# Patient Record
Sex: Female | Born: 1955 | Race: Black or African American | Hispanic: No | Smoking: Current every day smoker
Health system: Southern US, Community
[De-identification: ages and names within clinical notes are randomized; demographics above are authoritative.]

## PROBLEM LIST (undated history)

## (undated) DIAGNOSIS — C189 Malignant neoplasm of colon, unspecified: Secondary | ICD-10-CM

## (undated) DIAGNOSIS — C801 Malignant (primary) neoplasm, unspecified: Secondary | ICD-10-CM

## (undated) DIAGNOSIS — E079 Disorder of thyroid, unspecified: Secondary | ICD-10-CM

## (undated) HISTORY — PX: PORTA CATH INSERTION: CATH118285

## (undated) HISTORY — PX: COLON SURGERY: SHX602

---

## 1998-09-13 ENCOUNTER — Emergency Department (HOSPITAL_COMMUNITY): Admission: EM | Admit: 1998-09-13 | Discharge: 1998-09-13 | Payer: Self-pay | Admitting: Emergency Medicine

## 1999-11-06 ENCOUNTER — Emergency Department (HOSPITAL_COMMUNITY): Admission: EM | Admit: 1999-11-06 | Discharge: 1999-11-06 | Payer: Self-pay | Admitting: Emergency Medicine

## 2002-04-11 ENCOUNTER — Emergency Department (HOSPITAL_COMMUNITY): Admission: EM | Admit: 2002-04-11 | Discharge: 2002-04-11 | Payer: Self-pay | Admitting: Emergency Medicine

## 2002-04-12 ENCOUNTER — Encounter: Payer: Self-pay | Admitting: Emergency Medicine

## 2002-09-26 ENCOUNTER — Encounter: Payer: Self-pay | Admitting: Emergency Medicine

## 2002-09-26 ENCOUNTER — Encounter: Payer: Self-pay | Admitting: General Surgery

## 2002-09-27 ENCOUNTER — Encounter: Payer: Self-pay | Admitting: General Surgery

## 2002-09-27 ENCOUNTER — Inpatient Hospital Stay (HOSPITAL_COMMUNITY): Admission: EM | Admit: 2002-09-27 | Discharge: 2002-10-05 | Payer: Self-pay | Admitting: Emergency Medicine

## 2002-09-28 ENCOUNTER — Encounter: Payer: Self-pay | Admitting: Anesthesiology

## 2003-07-10 ENCOUNTER — Emergency Department (HOSPITAL_COMMUNITY): Admission: EM | Admit: 2003-07-10 | Discharge: 2003-07-10 | Payer: Self-pay | Admitting: Emergency Medicine

## 2008-04-17 HISTORY — PX: TUMOR REMOVAL: SHX12

## 2013-02-17 ENCOUNTER — Encounter (HOSPITAL_COMMUNITY): Payer: Self-pay | Admitting: Emergency Medicine

## 2013-02-17 ENCOUNTER — Emergency Department (HOSPITAL_COMMUNITY): Payer: Self-pay

## 2013-02-17 ENCOUNTER — Emergency Department (HOSPITAL_COMMUNITY)
Admission: EM | Admit: 2013-02-17 | Discharge: 2013-02-17 | Disposition: A | Discharge status: Home / Self Care | Payer: Self-pay | Attending: Emergency Medicine | Admitting: Emergency Medicine

## 2013-02-17 DIAGNOSIS — Z85038 Personal history of other malignant neoplasm of large intestine: Secondary | ICD-10-CM | POA: Insufficient documentation

## 2013-02-17 DIAGNOSIS — R748 Abnormal levels of other serum enzymes: Secondary | ICD-10-CM | POA: Insufficient documentation

## 2013-02-17 DIAGNOSIS — R609 Edema, unspecified: Secondary | ICD-10-CM | POA: Insufficient documentation

## 2013-02-17 DIAGNOSIS — E876 Hypokalemia: Secondary | ICD-10-CM | POA: Insufficient documentation

## 2013-02-17 DIAGNOSIS — F17200 Nicotine dependence, unspecified, uncomplicated: Secondary | ICD-10-CM | POA: Insufficient documentation

## 2013-02-17 LAB — URINALYSIS, ROUTINE W REFLEX MICROSCOPIC
Ketones, ur: NEGATIVE mg/dL
Nitrite: POSITIVE — AB
Protein, ur: 30 mg/dL — AB
pH: 5.5 (ref 5.0–8.0)

## 2013-02-17 LAB — COMPREHENSIVE METABOLIC PANEL
BUN: 13 mg/dL (ref 6–23)
CO2: 27 mEq/L (ref 19–32)
Chloride: 101 mEq/L (ref 96–112)
Creatinine, Ser: 0.39 mg/dL — ABNORMAL LOW (ref 0.50–1.10)
GFR calc non Af Amer: >90 mL/min (ref >90)
Glucose, Bld: 113 mg/dL — ABNORMAL HIGH (ref 70–99)
Total Bilirubin: 0.8 mg/dL (ref 0.3–1.2)

## 2013-02-17 LAB — CBC WITH DIFFERENTIAL/PLATELET
Basophils Absolute: 0 10*3/uL (ref 0.0–0.1)
Eosinophils Absolute: 0.2 10*3/uL (ref 0.0–0.7)
Eosinophils Relative: 2 % (ref 0–5)
HCT: 38.3 % (ref 36.0–46.0)
Hemoglobin: 12.3 g/dL (ref 12.0–15.0)
Lymphocytes Relative: 30 % (ref 12–46)
MCH: 27.3 pg (ref 26.0–34.0)
MCHC: 32.1 g/dL (ref 30.0–36.0)
MCV: 85.1 fL (ref 78.0–100.0)
Monocytes Absolute: 0.9 10*3/uL (ref 0.1–1.0)
Monocytes Relative: 11 % (ref 3–12)
Neutro Abs: 4.3 10*3/uL (ref 1.7–7.7)
RDW: 15 % (ref 11.5–15.5)
WBC: 7.6 10*3/uL (ref 4.0–10.5)

## 2013-02-17 LAB — URINE MICROSCOPIC-ADD ON

## 2013-02-17 LAB — PRO B NATRIURETIC PEPTIDE: Pro B Natriuretic peptide (BNP): 1769 pg/mL — ABNORMAL HIGH (ref 0–125)

## 2013-02-17 MED ORDER — POTASSIUM CHLORIDE CRYS ER 20 MEQ PO TBCR
40.0000 meq | EXTENDED_RELEASE_TABLET | Freq: Once | ORAL | Status: AC
  Administered 2013-02-17: 40 meq via ORAL
  Filled 2013-02-17: qty 2

## 2013-02-17 MED ORDER — FUROSEMIDE 20 MG PO TABS: 20.0000 mg | ORAL_TABLET | Freq: Every day | ORAL | Status: DC

## 2013-02-17 MED ORDER — POTASSIUM CHLORIDE ER 10 MEQ PO TBCR: 20.0000 meq | EXTENDED_RELEASE_TABLET | Freq: Two times a day (BID) | ORAL | Status: DC

## 2013-02-17 NOTE — ED Notes (Signed)
Called to triage area-pt in triage and upset because she wants a sandwich-escorted back to room-allowed to express frustration over being here "for so long"-Dr. Rhunette Croft is aware and will be in to discuss pt's lab work

## 2013-02-17 NOTE — ED Notes (Signed)
Pt ambulated to the restroom in attempt to obtain a urine sample. Pt voided however forgot to catch a clean sample. Will attempt later.

## 2013-02-17 NOTE — ED Notes (Addendum)
Pt states that she started having leg swelling 2 weeks ago. Denies diabetes or heart problems. Notes that she has had unprotected sexual relations with a man who has Hep C.

## 2013-02-17 NOTE — ED Provider Notes (Addendum)
CSN: [id number]     Arrival date & time 02/17/13  1647 History   First MD Initiated Contact with Patient 02/17/13 1812     Chief Complaint  Patient presents with  . Leg Swelling   (Consider location/radiation/quality/duration/timing/severity/associated sxs/prior Treatment) HPI Comments: 57 y/o with no medical hx, remote hx of colon CA, s/p resection comes in with cc of leg swelling. Pt started having bilateral leg swelling 2 weeks ago, and it is getting worse. There is no pain. Pt has no n/v/f/c. Pt has no hx of liver dz, however, has her boy friend has hep c. She denies any heart hx, and there is no dib. No hx of DVT, PE.  The history is provided by the patient.    No past medical history on file. Past Surgical History  Procedure Laterality Date  . Tumor removal  2010    colon   No family history on file. History  Substance Use Topics  . Smoking status: Current Every Day Smoker    Types: Cigarettes  . Smokeless tobacco: Not on file  . Alcohol Use: Yes     Comment: occasional   OB History   Grav Para Term Preterm Abortions TAB SAB Ect Mult Living                 Review of Systems  Constitutional: Negative for activity change.  HENT: Negative for facial swelling.   Respiratory: Negative for cough, shortness of breath and wheezing.   Cardiovascular: Negative for chest pain.  Gastrointestinal: Negative for nausea, vomiting, abdominal pain, diarrhea, constipation, blood in stool and abdominal distention.  Genitourinary: Negative for hematuria and difficulty urinating.  Musculoskeletal: Negative for neck pain.  Skin: Negative for color change and rash.  Neurological: Negative for speech difficulty and weakness.  Hematological: Does not bruise/bleed easily.  Psychiatric/Behavioral: Negative for confusion.    Allergies  Review of patient's allergies indicates no known allergies.  Home Medications  No current outpatient prescriptions on file. BP 126/80  Pulse 102   Temp(Src) 97.8 F (36.6 C) (Oral)  Resp 18  SpO2 95% Physical Exam  Nursing note and vitals reviewed. Constitutional: She is oriented to person, place, and time. She appears well-developed and well-nourished.  HENT:  Head: Normocephalic and atraumatic.  Eyes: EOM are normal. Pupils are equal, round, and reactive to light.  Neck: Neck supple.  Cardiovascular: Normal rate, regular rhythm and normal heart sounds.   No murmur heard. Pulmonary/Chest: Effort normal. No respiratory distress.  Abdominal: Soft. She exhibits no distension. There is no tenderness. There is no rebound and no guarding.  Musculoskeletal: She exhibits edema.  Bilateral and equal 1 + pitting edema. No erythema, no calf tenderness.  Neurological: She is alert and oriented to person, place, and time.  Skin: Skin is warm and dry.    ED Course  Procedures (including critical care time) Labs Review Labs Reviewed  COMPREHENSIVE METABOLIC PANEL - Abnormal; Notable for the following:    Potassium 2.5 (*)    Glucose, Bld 113 (*)    Creatinine, Ser 0.39 (*)    Albumin 3.2 (*)    AST 48 (*)    Alkaline Phosphatase 121 (*)    All other components within normal limits  PRO B NATRIURETIC PEPTIDE - Abnormal; Notable for the following:    Pro B Natriuretic peptide (BNP) 1769.0 (*)    All other components within normal limits  URINALYSIS, ROUTINE W REFLEX MICROSCOPIC - Abnormal; Notable for the following:    Color,  Urine AMBER (*)    APPearance TURBID (*)    Hgb urine dipstick MODERATE (*)    Bilirubin Urine SMALL (*)    Protein, ur 30 (*)    Nitrite POSITIVE (*)    Leukocytes, UA LARGE (*)    All other components within normal limits  URINE MICROSCOPIC-ADD ON - Abnormal; Notable for the following:    Bacteria, UA MANY (*)    All other components within normal limits  URINE CULTURE  CBC WITH DIFFERENTIAL   Imaging Review US Abdomen Complete  02/17/2013   CLINICAL DATA:  57 year old female with right upper  quadrant pain. Initial encounter.  EXAM: ULTRASOUND ABDOMEN COMPLETE  COMPARISON:  None.  FINDINGS: Gallbladder  Partially decompressed. No gallstones or wall thickening visualized. No sonographic Murphy sign noted.  Common bile duct  Diameter: 3 mm, normal.  Liver  No focal lesion identified. Within normal limits in parenchymal echogenicity.  IVC  No abnormality visualized.  Pancreas  Visualized portion unremarkable.  Spleen  Size and appearance within normal limits.  Right Kidney  Length: 11.2 cm. Echogenicity within normal limits. No mass or hydronephrosis visualized.  Left Kidney  Length: 10.4 cm. Echogenicity within normal limits. No mass or hydronephrosis visualized.  Abdominal aorta  No aneurysm visualized.  IMPRESSION: Normal gallbladder. Negative abdominal ultrasound.   Electronically Signed   By: Augusto Gamble M.D.   On: 02/17/2013 21:20    EKG Interpretation     Ventricular Rate:  100 PR Interval:  136 QRS Duration: 74 QT Interval:  397 QTC Calculation: 512 R Axis:   -43 Text Interpretation:  Sinus tachycardia Multiple ventricular premature complexes Probable left atrial enlargement Left axis deviation Abnormal T, consider ischemia, diffuse leads Prolonged QT interval New t wave inversions in the inferiolateral leads            MDM  No diagnosis found.  Pt comes in with cc of bilateral pitting edema. No cardiac hx, and no hx of liver dz - however, states that her boy friend has hep c (no other risk factors for liver dz). She has no hx of PE, DVt, and chances of bilateral DVTs is low - in light of no leg  Pain and no DVT risk factors.  Pt's lab show slightly elevated liver enz and BNP. She has hypokalemia as well.  [place] RUQ - negative.  I discussed all the findings with the patient. She will see her PCP in [place] within a week, and has good insurance and follow up, and is quite reliable, with her family at bedside too - who are also reliable. They prefer outpatient workup -and  i think that is reasonable given the good f/u and social circumstances.  We have giver her GI f/u with our Hospital just in case.  I think she needs and outpatient echo and outpatient GI fu. Her EKG shows new t wave inversions compared to 2004, so she likely had some insult to her heart. Potassium supplement and lasix prescribed.  UA is dirty - but she has no UTI like sx.      Derwood Kaplan, MD 02/17/13 6213     Derwood Kaplan, MD 02/17/13 2316

## 2013-02-17 NOTE — ED Notes (Signed)
Dr. Rhunette Croft aware of pt's critical potassium level

## 2013-02-17 NOTE — ED Notes (Signed)
Critical K of 2.5 reported to [employer]

## 2013-02-17 NOTE — ED Notes (Signed)
US at bedside

## 2013-02-17 NOTE — ED Notes (Signed)
Patient has gross edema to bilateral lowe extremities x 2 weeks. Patient denies any pain at this time.

## 2013-02-19 ENCOUNTER — Telehealth (HOSPITAL_BASED_OUTPATIENT_CLINIC_OR_DEPARTMENT_OTHER): Payer: Self-pay | Admitting: *Deleted

## 2013-02-19 LAB — URINE CULTURE: Colony Count: >=100000

## 2013-02-19 NOTE — Telephone Encounter (Signed)
Pt wants referral for dr in [facility], informed pt that she would need to call pcp. Pt call PCP and they wanted faxed records of her visit. Pt did not follow up with her chemo after colon CA three yrs ago. Pt was given information to call medical records about getting records faxed to drs office in [facility] [phone]. Her number is [phone].

## 2013-02-20 NOTE — Progress Notes (Signed)
ED Antimicrobial Stewardship Positive Culture Follow Up   HAJRA PORT is an 57 y.o. female who presented to [facility] on 02/17/2013 with a chief complaint of  Chief Complaint  Patient presents with  . Leg Swelling    Recent Results (from the past 720 hour(s))  URINE CULTURE     Status: None   Collection Time    02/17/13  7:45 PM      Result Value Range Status   Specimen Description URINE, CLEAN CATCH   Final   Special Requests NONE   Final   Culture  Setup Time     Final   Value: 02/18/2013 03:15     Performed at [facility] Count     Final   Value: >=100,000 COLONIES/ML     Performed at [facility]   Culture     Final   Value: ESCHERICHIA COLI     Performed at [facility]   Report Status 02/19/2013 FINAL   Final   Organism ID, Bacteria ESCHERICHIA COLI   Final     [x]  Patient discharged originally without antimicrobial agent and treatment is now indicated  New antibiotic prescription: Bactrim DS one tablet BID x 3 days  ED Provider: Fayrene Helper, PA-C   Mickeal Skinner 02/20/2013, 10:11 AM Infectious Diseases Pharmacist Phone# [phone]

## 2013-02-20 NOTE — ED Notes (Signed)
Post ED Visit - Positive Culture Follow-up: Successful Patient Follow-Up  Culture assessed and recommendations reviewed by: []  Wes Dulaney, Pharm.D., BCPS [x]  Celedonio Miyamoto, Pharm.D., BCPS []  Georgina Pillion, [address].D., BCPS []  [place], [address].D., BCPS, AAHIVP []  Estella Husk, Pharm.D., BCPS, AAHIVP  [X]  Patient discharged originally without antimicrobial agent and treatment is now indicated  New antibiotic prescription: Bactrim DS one tablet BID x 3 days  ED Provider: Fayrene Helper, PA-C      Susan Pearson 02/20/2013, 3:13 PM

## 2013-02-22 ENCOUNTER — Telehealth (HOSPITAL_COMMUNITY): Payer: Self-pay | Admitting: *Deleted

## 2013-02-22 NOTE — ED Notes (Signed)
Pt called several times about +UTI,  Pt calling sts she did NOT have RUQ pain and she wants to know why this is on her record.  Pt requesting to speak with Doctor or nurse that saw her.  Pt given number for [employer].

## 2013-02-26 ENCOUNTER — Telehealth (HOSPITAL_COMMUNITY): Payer: Self-pay | Admitting: Emergency Medicine

## 2013-02-26 NOTE — ED Notes (Signed)
Rx for Bactrim DS 1 tablet BID x 3 days called to [facility] ([phone]). Rx prescribed by Fayrene Helper PA-C.

## 2016-07-11 ENCOUNTER — Emergency Department (HOSPITAL_COMMUNITY): Payer: Self-pay

## 2016-07-11 ENCOUNTER — Inpatient Hospital Stay (HOSPITAL_COMMUNITY): Admit: 2016-07-11 | Payer: Medicaid Other

## 2016-07-11 ENCOUNTER — Encounter (HOSPITAL_COMMUNITY): Payer: Self-pay | Admitting: *Deleted

## 2016-07-11 ENCOUNTER — Observation Stay (HOSPITAL_COMMUNITY)
Admission: EM | Admit: 2016-07-11 | Discharge: 2016-07-12 | Disposition: A | Discharge status: Home / Self Care | Payer: Self-pay | Attending: Internal Medicine | Admitting: Internal Medicine

## 2016-07-11 DIAGNOSIS — R05 Cough: Secondary | ICD-10-CM

## 2016-07-11 DIAGNOSIS — Z9221 Personal history of antineoplastic chemotherapy: Secondary | ICD-10-CM | POA: Insufficient documentation

## 2016-07-11 DIAGNOSIS — D72829 Elevated white blood cell count, unspecified: Secondary | ICD-10-CM | POA: Insufficient documentation

## 2016-07-11 DIAGNOSIS — I2721 Secondary pulmonary arterial hypertension: Secondary | ICD-10-CM | POA: Insufficient documentation

## 2016-07-11 DIAGNOSIS — E0590 Thyrotoxicosis, unspecified without thyrotoxic crisis or storm: Secondary | ICD-10-CM

## 2016-07-11 DIAGNOSIS — I2699 Other pulmonary embolism without acute cor pulmonale: Secondary | ICD-10-CM

## 2016-07-11 DIAGNOSIS — R042 Hemoptysis: Secondary | ICD-10-CM | POA: Insufficient documentation

## 2016-07-11 DIAGNOSIS — F17210 Nicotine dependence, cigarettes, uncomplicated: Secondary | ICD-10-CM | POA: Insufficient documentation

## 2016-07-11 DIAGNOSIS — J9601 Acute respiratory failure with hypoxia: Principal | ICD-10-CM | POA: Insufficient documentation

## 2016-07-11 DIAGNOSIS — R0602 Shortness of breath: Secondary | ICD-10-CM | POA: Diagnosis present

## 2016-07-11 DIAGNOSIS — R059 Cough, unspecified: Secondary | ICD-10-CM

## 2016-07-11 DIAGNOSIS — Z85038 Personal history of other malignant neoplasm of large intestine: Secondary | ICD-10-CM | POA: Insufficient documentation

## 2016-07-11 HISTORY — DX: Malignant neoplasm of colon, unspecified: C18.9

## 2016-07-11 HISTORY — DX: Malignant (primary) neoplasm, unspecified: C80.1

## 2016-07-11 HISTORY — DX: Disorder of thyroid, unspecified: E07.9

## 2016-07-11 LAB — CBC WITH DIFFERENTIAL/PLATELET
Basophils Absolute: 0 10*3/uL (ref 0.0–0.1)
Basophils Relative: 0 %
EOS PCT: 3 %
Eosinophils Absolute: 0.3 10*3/uL (ref 0.0–0.7)
HCT: 39.7 % (ref 36.0–46.0)
Hemoglobin: 13.1 g/dL (ref 12.0–15.0)
LYMPHS PCT: 22 %
Lymphs Abs: 2.6 10*3/uL (ref 0.7–4.0)
MCH: 29.6 pg (ref 26.0–34.0)
MCHC: 33 g/dL (ref 30.0–36.0)
MCV: 89.8 fL (ref 78.0–100.0)
MONO ABS: 1 10*3/uL (ref 0.1–1.0)
Monocytes Relative: 8 %
Neutro Abs: 7.9 10*3/uL — ABNORMAL HIGH (ref 1.7–7.7)
Neutrophils Relative %: 67 %
PLATELETS: 364 10*3/uL (ref 150–400)
RBC: 4.42 MIL/uL (ref 3.87–5.11)
RDW: 14.1 % (ref 11.5–15.5)
WBC: 11.8 10*3/uL — ABNORMAL HIGH (ref 4.0–10.5)

## 2016-07-11 LAB — COMPREHENSIVE METABOLIC PANEL
ALT: 15 U/L (ref 14–54)
AST: 25 U/L (ref 15–41)
Albumin: 3 g/dL — ABNORMAL LOW (ref 3.5–5.0)
Alkaline Phosphatase: 165 U/L — ABNORMAL HIGH (ref 38–126)
Anion gap: 8 (ref 5–15)
BUN: 5 mg/dL — ABNORMAL LOW (ref 6–20)
CO2: 24 mmol/L (ref 22–32)
Calcium: 8.4 mg/dL — ABNORMAL LOW (ref 8.9–10.3)
Chloride: 105 mmol/L (ref 101–111)
Creatinine, Ser: 0.42 mg/dL — ABNORMAL LOW (ref 0.44–1.00)
Glucose, Bld: 95 mg/dL (ref 65–99)
POTASSIUM: 4.3 mmol/L (ref 3.5–5.1)
Sodium: 137 mmol/L (ref 135–145)
Total Bilirubin: 1 mg/dL (ref 0.3–1.2)
Total Protein: 7.2 g/dL (ref 6.5–8.1)

## 2016-07-11 LAB — I-STAT CHEM 8, ED
BUN: 4 mg/dL — ABNORMAL LOW (ref 6–20)
CREATININE: 0.4 mg/dL — AB (ref 0.44–1.00)
Calcium, Ion: 1.12 mmol/L — ABNORMAL LOW (ref 1.15–1.40)
Chloride: 101 mmol/L (ref 101–111)
Glucose, Bld: 118 mg/dL — ABNORMAL HIGH (ref 65–99)
HCT: 40 % (ref 36.0–46.0)
HEMOGLOBIN: 13.6 g/dL (ref 12.0–15.0)
Potassium: 3.7 mmol/L (ref 3.5–5.1)
Sodium: 141 mmol/L (ref 135–145)
TCO2: 29 mmol/L (ref 0–100)

## 2016-07-11 LAB — I-STAT TROPONIN, ED: TROPONIN I, POC: 0 ng/mL (ref 0.00–0.08)

## 2016-07-11 LAB — BRAIN NATRIURETIC PEPTIDE: B NATRIURETIC PEPTIDE 5: 130.6 pg/mL — AB (ref 0.0–100.0)

## 2016-07-11 LAB — HEPARIN LEVEL (UNFRACTIONATED): HEPARIN UNFRACTIONATED: 0.25 [IU]/mL — AB (ref 0.30–0.70)

## 2016-07-11 LAB — D-DIMER, QUANTITATIVE (NOT AT ARMC): D DIMER QUANT: 0.73 ug{FEU}/mL — AB (ref 0.00–0.50)

## 2016-07-11 MED ORDER — GUAIFENESIN 100 MG/5ML PO SYRP
200.0000 mg | ORAL_SOLUTION | Freq: Three times a day (TID) | ORAL | Status: DC | PRN
  Administered 2016-07-11 – 2016-07-12 (×3): 200 mg via ORAL
  Filled 2016-07-11 (×5): qty 10

## 2016-07-11 MED ORDER — SODIUM CHLORIDE 0.9% FLUSH: 3.0000 mL | INTRAVENOUS | Status: DC | PRN

## 2016-07-11 MED ORDER — SODIUM CHLORIDE 0.9% FLUSH
3.0000 mL | Freq: Two times a day (BID) | INTRAVENOUS | Status: DC
  Administered 2016-07-12: 3 mL via INTRAVENOUS

## 2016-07-11 MED ORDER — HEPARIN (PORCINE) IN NACL 100-0.45 UNIT/ML-% IJ SOLN
1450.0000 [IU]/h | INTRAMUSCULAR | Status: AC
  Administered 2016-07-11: 1100 [IU]/h via INTRAVENOUS
  Filled 2016-07-11 (×2): qty 250

## 2016-07-11 MED ORDER — METHIMAZOLE 10 MG PO TABS
20.0000 mg | ORAL_TABLET | Freq: Every day | ORAL | Status: DC
  Administered 2016-07-11 – 2016-07-12 (×2): 20 mg via ORAL
  Filled 2016-07-11 (×2): qty 2

## 2016-07-11 MED ORDER — SODIUM CHLORIDE 0.9 % IV SOLN: 250.0000 mL | INTRAVENOUS | Status: DC | PRN

## 2016-07-11 MED ORDER — ACETAMINOPHEN 500 MG PO TABS
500.0000 mg | ORAL_TABLET | Freq: Four times a day (QID) | ORAL | Status: DC | PRN
  Administered 2016-07-11 – 2016-07-12 (×2): 500 mg via ORAL
  Filled 2016-07-11: qty 1

## 2016-07-11 MED ORDER — IOPAMIDOL (ISOVUE-370) INJECTION 76%
INTRAVENOUS | Status: AC
  Administered 2016-07-11: 80 mL
  Filled 2016-07-11: qty 100

## 2016-07-11 MED ORDER — HEPARIN BOLUS VIA INFUSION
4000.0000 [IU] | Freq: Once | INTRAVENOUS | Status: AC
  Administered 2016-07-11: 4000 [IU] via INTRAVENOUS
  Filled 2016-07-11: qty 4000

## 2016-07-11 MED ORDER — ATENOLOL 25 MG PO TABS
25.0000 mg | ORAL_TABLET | Freq: Every day | ORAL | Status: DC
  Administered 2016-07-11 – 2016-07-12 (×2): 25 mg via ORAL
  Filled 2016-07-11 (×2): qty 1

## 2016-07-11 NOTE — ED Triage Notes (Signed)
Patient c/o cough onset last thurs.  States she coughs up brownish sputum at times. Chest only hurts with deep breath and cough.

## 2016-07-11 NOTE — Progress Notes (Signed)
ANTICOAGULATION CONSULT NOTE - Initial Consult  Pharmacy Consult for heparin Indication: pulmonary embolus  No Known Allergies  Patient Measurements: Height: 5' (152.4 cm) Weight: 192 lb (87.1 kg) IBW/kg (Calculated) : 45.5 Heparin Dosing Weight: 65.9kg  Vital Signs: Temp: 97.8 F (36.6 C) (03/27 0824) Temp Source: Oral (03/27 0824) BP: 127/90 (03/27 1500) Pulse Rate: 75 (03/27 1500)  Labs:  Recent Labs  07/11/16 1344  HGB 13.6  HCT 40.0  CREATININE 0.40*    Estimated Creatinine Clearance: 74.2 mL/min (A) (by C-G formula based on SCr of 0.4 mg/dL (L)).   Medical History: Past Medical History:  Diagnosis Date  . Cancer ([place])   . Colon cancer ([place])   . Thyroid disease     Medications:  Infusions:  . heparin      Assessment: 25 yof presented to the ED with cough and chest pain. Found to have small non-occlusive PE. To start IV heparin. Baseline H/H is WNL. Platelets are pending but have been normal in the past. She is not on anticoagulation PTA.   Goal of Therapy:  Heparin level 0.3-0.7 units/ml Monitor platelets by anticoagulation protocol: Yes   Plan:  Heparin bolus 4000 units IV x 1 Heparin gtt 1100 units/hr Check a 6 hr heparin level Daily heparin level and CBC  Gibran Veselka, Rande Lawman 07/11/2016,3:15 PM

## 2016-07-11 NOTE — ED Notes (Signed)
Transported to xray 

## 2016-07-11 NOTE — ED Notes (Signed)
Pt states no one  Has been into see her in over two hours and is upset because she wants to leave by two. Myself and two other RNs have seen her multiple times. Pt wants to know why everything is taking so long and has disconnected herself from the monitoring equipment. Pt informed as soon as the lab results come back this RN will call CT and inform them she is ready for her scan. This seemed to appease the pt and she is somewhat agreeable.

## 2016-07-11 NOTE — ED Notes (Signed)
Pt ambulatory to the restroom.  

## 2016-07-11 NOTE — ED Notes (Signed)
Labs are back, CT informed and here for pt now.

## 2016-07-11 NOTE — ED Notes (Signed)
Pt did not need anything at this time  

## 2016-07-11 NOTE — ED Provider Notes (Signed)
[facility] DEPT Provider Note   CSN: [id number] Arrival date & time: 07/11/16  0818     History   Chief Complaint Chief Complaint  Patient presents with  . Cough    HPI Susan Pearson is a 61 y.o. female presenting with 1 week of persistent coughing. She states that she has not been able to sleep for the past 3 nights that she was coughing the entire night. she noticed a little bit of blood in her sputum and a brown-colored sputum. She has been taking Alka-Seltzer and Tylenol everyday without relief. She also endorses a left-sided substernal chest discomfort with deep inhalation. She denies any congestion, fever, chills, nausea, vomiting, diarrhea, myalgias or any other symptoms. Denies recent surgery, prolonged immobilization, history of DVT/PE, she does have a history of malignancy with colon cancer.  HPI  Past Medical History:  Diagnosis Date  . Cancer ([place])   . Colon cancer ([place])   . Thyroid disease     Patient Active Problem List   Diagnosis Date Noted  . Shortness of breath 07/11/2016    Past Surgical History:  Procedure Laterality Date  . COLON SURGERY    . PORTA CATH INSERTION    . TUMOR REMOVAL  2010   colon    OB History    No data available       Home Medications    Prior to Admission medications   Medication Sig Start Date End Date Taking? Authorizing Provider  acetaminophen (TYLENOL) 500 MG tablet Take 500 mg by mouth every 6 (six) hours as needed for mild pain.   Yes Historical Provider, MD  atenolol (TENORMIN) 50 MG tablet Take 25 mg by mouth daily. 05/31/16  Yes Historical Provider, MD  guaifenesin (ROBITUSSIN) 100 MG/5ML syrup Take 200 mg by mouth 3 (three) times daily as needed for cough.   Yes Historical Provider, MD  methimazole (TAPAZOLE) 10 MG tablet Take 20 mg by mouth daily. 05/31/16  Yes Historical Provider, MD  furosemide (LASIX) 20 MG tablet Take 1 tablet (20 mg total) by mouth daily. Patient not taking: Reported on 07/11/2016  02/17/13   Varney Biles, MD  potassium chloride (K-DUR) 10 MEQ tablet Take 2 tablets (20 mEq total) by mouth 2 (two) times daily. Patient not taking: Reported on 07/11/2016 02/17/13   Varney Biles, MD    Family History No family history on file.  Social History Social History  Substance Use Topics  . Smoking status: Current Every Day Smoker    Types: Cigarettes  . Smokeless tobacco: Never Used  . Alcohol use No     Comment: occasional     Allergies   Patient has no known allergies.   Review of Systems Review of Systems  Constitutional: Negative for chills and fever.  HENT: Negative for congestion, ear pain, sinus pain, sinus pressure and sore throat.   Eyes: Negative for pain and visual disturbance.  Respiratory: Positive for cough and chest tightness. Negative for shortness of breath, wheezing and stridor.   Cardiovascular: Negative for chest pain and palpitations.  Gastrointestinal: Negative for abdominal distention, abdominal pain, diarrhea, nausea and vomiting.  Genitourinary: Negative for difficulty urinating, dysuria, flank pain, frequency and hematuria.  Musculoskeletal: Negative for arthralgias, back pain, gait problem, myalgias, neck pain and neck stiffness.  Skin: Negative for color change, pallor and rash.  Neurological: Negative for seizures and syncope.     Physical Exam Updated Vital Signs BP 127/90   Pulse 75   Temp 97.8 F (36.6 C) (  Oral)   Resp 18   Ht 5' (1.524 m)   Wt 87.1 kg   SpO2 99%   BMI 37.50 kg/m   Physical Exam  Constitutional: She appears well-developed and well-nourished. No distress.  Patient is afebrile, nontoxic-appearing, sitting comfortably in bed in no acute distress. She is coughing throughout the entire exam.  HENT:  Head: Normocephalic and atraumatic.  Eyes: Conjunctivae and EOM are normal. Right eye exhibits no discharge. Left eye exhibits no discharge.  Neck: Normal range of motion. Neck supple.  Cardiovascular: Normal  rate, regular rhythm, normal heart sounds and intact distal pulses.   No murmur heard. Pulmonary/Chest: Effort normal. No respiratory distress. She has no wheezes. She has no rales. She exhibits no tenderness.  Decreased lung sounds in the left upper lobe  Musculoskeletal: She exhibits no edema.  Neurological: She is alert.  Skin: Skin is warm and dry. No rash noted. She is not diaphoretic. No erythema. No pallor.  Psychiatric: She has a normal mood and affect. Her behavior is normal.  Nursing note and vitals reviewed.    ED Treatments / Results  Labs (all labs ordered are listed, but only abnormal results are displayed) Labs Reviewed  CBC WITH DIFFERENTIAL/PLATELET - Abnormal; Notable for the following:       Result Value   WBC 11.8 (*)    Neutro Abs 7.9 (*)    All other components within normal limits  COMPREHENSIVE METABOLIC PANEL - Abnormal; Notable for the following:    BUN 5 (*)    Creatinine, Ser 0.42 (*)    Calcium 8.4 (*)    Albumin 3.0 (*)    Alkaline Phosphatase 165 (*)    All other components within normal limits  I-STAT CHEM 8, ED - Abnormal; Notable for the following:    BUN 4 (*)    Creatinine, Ser 0.40 (*)    Glucose, Bld 118 (*)    Calcium, Ion 1.12 (*)    All other components within normal limits  HEPARIN LEVEL (UNFRACTIONATED)  BRAIN NATRIURETIC PEPTIDE  HEPARIN LEVEL (UNFRACTIONATED)  CBC  I-STAT TROPOININ, ED    EKG  EKG Interpretation None       Radiology Dg Chest 2 View  Result Date: 07/11/2016 CLINICAL DATA:  Cough for several days, chest discomfort on deep inspiration, history of carcinoma of the colon, smoking history EXAM: CHEST  2 VIEW COMPARISON:  None. FINDINGS: No active infiltrate or effusion is seen. No metastatic involvement of the lungs is noted. Mediastinal and hilar contours are unremarkable. The heart is mildly enlarged. A right-sided Port-A-Cath is present with the tip seen to the mid upper SVC. There are degenerative changes  in the mid lower thoracic spine. IMPRESSION: 1. No active lung disease.  No evidence of metastases to the lungs. 2. Port-A-Cath tip overlies the mid upper SVC Electronically Signed   By: Ivar Drape M.D.   On: 07/11/2016 10:22   Ct Angio Chest Pe W And/or Wo Contrast  Result Date: 07/11/2016 CLINICAL DATA:  Persistent cough for 5 days, hemoptysis, shortness of breath EXAM: CT ANGIOGRAPHY CHEST WITH CONTRAST TECHNIQUE: Multidetector CT imaging of the chest was performed using the standard protocol during bolus administration of intravenous contrast. Multiplanar CT image reconstructions and MIPs were obtained to evaluate the vascular anatomy. CONTRAST:  80 cc Isovue 370 COMPARISON:  Chest x-ray of 07/11/2016 FINDINGS: Cardiovascular: The pulmonary arteries are relatively well opacified. No central pulmonary embolus is seen. The pulmonary arterial trunk is prominent however suggesting a  degree of pulmonary arterial hypertension. Within the smaller branches of the lower lobe pulmonary arteries, a few very small nonocclusive pulmonary emboli cannot be excluded. If these small defects actually represent small pulmonary emboli then they would be of questionable clinical significance. A venous Doppler ultrasound of the legs may be helpful to exclude thrombus. The heart is mildly enlarged. No pericardial effusion is seen. The mid ascending thoracic aorta measures 37 mm in diameter. Mediastinum/Nodes: No mediastinal or hilar adenopathy is seen. There is diffuse enlargement of the thyroid gland suggesting thyroid goiter. Clinical correlation is recommended. Lungs/Pleura: On lung window images, no parenchymal infiltrate is seen. There is no evidence of pleural effusion. No suspicious lung nodule is seen. The central airway is patent. Upper Abdomen: There are artifacts within the upper abdomen in this large patient, but no significant abnormality is seen on the limited views obtained. Musculoskeletal: There are diffuse  degenerative changes throughout the thoracic spine. No compression deformity is seen. Review of the MIP images confirms the above findings. IMPRESSION: 1. Very small scattered nonocclusive pulmonary emboli cannot be excluded in the branches of the pulmonary arteries to the lower lobes. No central embolism is evident and these possible small peripheral emboli are of questionable clinical significance. Venous Doppler of the legs may be helpful. 2. Diffusely enlarged thyroid gland consistent with thyroid goiter. Correlate clinically. 3. Prominent pulmonary arteries trunk most consistent with pulmonary arterial hypertension. Electronically Signed   By: Ivar Drape M.D.   On: 07/11/2016 14:28    Procedures Procedures (including critical care time)  Medications Ordered in ED Medications  heparin ADULT infusion 100 units/mL (25000 units/237mL sodium chloride 0.45%) (1,100 Units/hr Intravenous New Bag/Given 07/11/16 1525)  iopamidol (ISOVUE-370) 76 % injection (80 mLs  Contrast Given 07/11/16 1357)  heparin bolus via infusion 4,000 Units (4,000 Units Intravenous Bolus from Bag 07/11/16 1526)     Initial Impression / Assessment and Plan / ED Course  I have reviewed the triage vital signs and the nursing notes.  Pertinent labs & imaging results that were available during my care of the patient were reviewed by me and considered in my medical decision making (see chart for details).    10:20 went to see patient but she was still not in her room.  Patient presents with 1 week of persistent coughing and potential hemoptysis this morning without any systemic symptoms or congestion suggestive of an infectious process. Her chest x-ray is negative for pneumonia. She has had no fever, chills nausea, vomiting or other symptoms. She does have a history of malignancy Colon.  ordered CTA Patient was reluctant to further imaging states that she is hungry and doesn't think that she has a clot. I had to explain to her  the risk associated with refusing further evaluation.  Spoke with her daughter over the phone who is a Marine scientist to explain her workup and the next step. Daughter and patient were both agreeable with CTa.  14:45- had a discussion with patient and daughter regarding CT results and recommendation for treatment and admission. Patient wanted to talk it over with her daughter prior to making a decision. 15:00- patient agreed to admission but requested to go smoke a cigarette outside first. Strongly discouraged this request and offered nicotine patch. Ordered heparin drip.  Patient was discussed with Dr. Billy Fischer who has seen patient and agrees with assessment and plan.  Patient will be admitted.  Final Clinical Impressions(s) / ED Diagnoses   Final diagnoses:  Other [facility]ism [facility]    New Prescriptions New Prescriptions   No medications on file     Emeline General, Hershal Coria 07/11/16 [place], MD 07/15/16 [phone]

## 2016-07-11 NOTE — Progress Notes (Signed)
ANTICOAGULATION CONSULT NOTE   Pharmacy Consult for heparin Indication: pulmonary embolus  No Known Allergies  Patient Measurements: Height: 5\' 1"  (154.9 cm) Weight: 206 lb 4.8 oz (93.6 kg) IBW/kg (Calculated) : 47.8 Heparin Dosing Weight: 65.9kg  Vital Signs: Temp: 98.2 F (36.8 C) (03/27 2023) Temp Source: Oral (03/27 2023) BP: 136/86 (03/27 2023) Pulse Rate: 87 (03/27 2023)  Labs:  Recent Labs  07/11/16 1344 07/11/16 1522 07/11/16 2158  HGB 13.6 13.1  --   HCT 40.0 39.7  --   PLT  --  364  --   HEPARINUNFRC  --   --  0.25*  CREATININE 0.40* 0.42*  --     Estimated Creatinine Clearance: 79 mL/min (A) (by C-G formula based on SCr of 0.42 mg/dL (L)).   Medical History: Past Medical History:  Diagnosis Date  . Cancer ([place])   . Colon cancer ([place])   . Thyroid disease     Medications:  Infusions:  . heparin 1,100 Units/hr (07/11/16 1525)    Assessment: 23 yof presented to the ED with cough and chest pain. Found to have small non-occlusive PE. To start IV heparin. Baseline H/H is WNL. Platelets are pending but have been normal in the past. She is not on anticoagulation PTA.   Initial heparin level is subtherapeutic at 0.25.  Goal of Therapy:  Heparin level 0.3-0.7 units/ml Monitor platelets by anticoagulation protocol: Yes   Plan:  1. Increase heparin infusion to 1250 units/hr 2. Heparin level with am labs  Vincenza Hews, PharmD, BCPS 07/11/2016, 10:37 PM

## 2016-07-11 NOTE — H&P (Signed)
[facility] History and Physical  TAHISHA HAKIM HWE:[id number] DOB: January 24, 1956 DOA: 07/11/2016  PCP: No PCP Per Patient  Patient coming from: Home  Chief Complaint: Shortness of breath, cough  HPI: Susan Pearson is a 61 y.o. female with a medical history of colon cancer, hyperthyroidism, who presented to the emergency department with complaints of cough, fatigue, shortness of breath for 1 week. Patient also endorses coughing up dark brown phlegm yesterday evening. Nothing seemed to make her cough better or worse, she did try over-the-counter Tylenol and Alka-Seltzer without relief. Patient also is a smoker. She denies a history of recent travel, ill contacts. Currently denies any chest pain, abdominal pain, nausea vomiting, diarrhea constipation, dizziness or headache, changes in bowel pattern or positive urination.  ED Course: Found to have questionable PE on CTA chest, started on heparin. Was hypoxic. TRH called for admission.   Review of Systems:  All other systems reviewed and are negative.   Past Medical History:  Diagnosis Date  . Cancer ([place])   . Colon cancer ([place])   . Thyroid disease     Past Surgical History:  Procedure Laterality Date  . COLON SURGERY    . PORTA CATH INSERTION    . TUMOR REMOVAL  2010   colon    Social History:  reports that she has been smoking Cigarettes.  She has never used smokeless tobacco. She reports that she does not drink alcohol or use drugs.  No Known Allergies  Family History  Problem Relation Age of Onset  . Hypertension Mother   . Thyroid disease Mother      Prior to Admission medications   Medication Sig Start Date End Date Taking? Authorizing Provider  acetaminophen (TYLENOL) 500 MG tablet Take 500 mg by mouth every 6 (six) hours as needed for mild pain.   Yes Historical Provider, MD  atenolol (TENORMIN) 50 MG tablet Take 25 mg by mouth daily. 05/31/16  Yes Historical Provider, MD  guaifenesin (ROBITUSSIN)  100 MG/5ML syrup Take 200 mg by mouth 3 (three) times daily as needed for cough.   Yes Historical Provider, MD  methimazole (TAPAZOLE) 10 MG tablet Take 20 mg by mouth daily. 05/31/16  Yes Historical Provider, MD    Physical Exam: Vitals:   07/11/16 1645 07/11/16 1700  BP: 133/72 129/66  Pulse: 79 76  Resp: (!) 28 (!) 24  Temp:       General: Well developed, well nourished, NAD, appears stated age  HEENT: NCAT, PERRLA, EOMI, Anicteic Sclera, mucous membranes moist.   Neck: Supple, no JVD, no masses  Cardiovascular: S1 S2 auscultated, 3/6SEM, Regular rate and rhythm.  Respiratory: Mildly diminished breath sounds LLL, otherwise clear. +dry cough  Abdomen: Soft, nontender, nondistended, + bowel sounds  Extremities: warm dry without cyanosis clubbing or edema, RLE larger than LLE  Neuro: AAOx3, cranial nerves grossly intact. Strength 5/5 in patient's upper and lower extremities bilaterally  Skin: Without rashes exudates or nodules  Psych: Normal affect and demeanor with intact judgement and insight  Labs on Admission: I have personally reviewed following labs and imaging studies CBC:  Recent Labs Lab 07/11/16 1344 07/11/16 1522  WBC  --  11.8*  NEUTROABS  --  7.9*  HGB 13.6 13.1  HCT 40.0 39.7  MCV  --  89.8  PLT  --  893   Basic Metabolic Panel:  Recent Labs Lab 07/11/16 1344 07/11/16 1522  NA 141 137  K 3.7 4.3  CL 101 105  CO2  --  24  GLUCOSE 118* 95  BUN 4* 5*  CREATININE 0.40* 0.42*  CALCIUM  --  8.4*   GFR: Estimated Creatinine Clearance: 74.2 mL/min (A) (by C-G formula based on SCr of 0.42 mg/dL (L)). Liver Function Tests:  Recent Labs Lab 07/11/16 1522  AST 25  ALT 15  ALKPHOS 165*  BILITOT 1.0  PROT 7.2  ALBUMIN 3.0*   No results for input(s): LIPASE, AMYLASE in the last 168 hours. No results for input(s): AMMONIA in the last 168 hours. Coagulation Profile: No results for input(s): INR, PROTIME in the last 168 hours. Cardiac  Enzymes: No results for input(s): CKTOTAL, CKMB, CKMBINDEX, TROPONINI in the last 168 hours. BNP (last 3 results) No results for input(s): PROBNP in the last 8760 hours. HbA1C: No results for input(s): HGBA1C in the last 72 hours. CBG: No results for input(s): GLUCAP in the last 168 hours. Lipid Profile: No results for input(s): CHOL, HDL, LDLCALC, TRIG, CHOLHDL, LDLDIRECT in the last 72 hours. Thyroid Function Tests: No results for input(s): TSH, T4TOTAL, FREET4, T3FREE, THYROIDAB in the last 72 hours. Anemia Panel: No results for input(s): VITAMINB12, FOLATE, FERRITIN, TIBC, IRON, RETICCTPCT in the last 72 hours. Urine analysis:    Component Value Date/Time   COLORURINE AMBER (A) 02/17/2013 1945   APPEARANCEUR TURBID (A) 02/17/2013 1945   LABSPEC 1.027 02/17/2013 1945   PHURINE 5.5 02/17/2013 1945   GLUCOSEU NEGATIVE 02/17/2013 1945   HGBUR MODERATE (A) 02/17/2013 1945   BILIRUBINUR SMALL (A) 02/17/2013 1945   KETONESUR NEGATIVE 02/17/2013 1945   PROTEINUR 30 (A) 02/17/2013 1945   UROBILINOGEN 1.0 02/17/2013 1945   NITRITE POSITIVE (A) 02/17/2013 1945   LEUKOCYTESUR LARGE (A) 02/17/2013 1945   Sepsis Labs: @LABRCNTIP (procalcitonin:4,lacticidven:4) )No results found for this or any previous visit (from the past 240 hour(s)).   Radiological Exams on Admission: Dg Chest 2 View  Result Date: 07/11/2016 CLINICAL DATA:  Cough for several days, chest discomfort on deep inspiration, history of carcinoma of the colon, smoking history EXAM: CHEST  2 VIEW COMPARISON:  None. FINDINGS: No active infiltrate or effusion is seen. No metastatic involvement of the lungs is noted. Mediastinal and hilar contours are unremarkable. The heart is mildly enlarged. A right-sided Port-A-Cath is present with the tip seen to the mid upper SVC. There are degenerative changes in the mid lower thoracic spine. IMPRESSION: 1. No active lung disease.  No evidence of metastases to the lungs. 2. Port-A-Cath tip  overlies the mid upper SVC Electronically Signed   By: Ivar Drape M.D.   On: 07/11/2016 10:22   Ct Angio Chest Pe W And/or Wo Contrast  Result Date: 07/11/2016 CLINICAL DATA:  Persistent cough for 5 days, hemoptysis, shortness of breath EXAM: CT ANGIOGRAPHY CHEST WITH CONTRAST TECHNIQUE: Multidetector CT imaging of the chest was performed using the standard protocol during bolus administration of intravenous contrast. Multiplanar CT image reconstructions and MIPs were obtained to evaluate the vascular anatomy. CONTRAST:  80 cc Isovue 370 COMPARISON:  Chest x-ray of 07/11/2016 FINDINGS: Cardiovascular: The pulmonary arteries are relatively well opacified. No central pulmonary embolus is seen. The pulmonary arterial trunk is prominent however suggesting a degree of pulmonary arterial hypertension. Within the smaller branches of the lower lobe pulmonary arteries, a few very small nonocclusive pulmonary emboli cannot be excluded. If these small defects actually represent small pulmonary emboli then they would be of questionable clinical significance. A venous Doppler ultrasound of the legs may be helpful to exclude thrombus. The heart is mildly enlarged.  No pericardial effusion is seen. The mid ascending thoracic aorta measures 37 mm in diameter. Mediastinum/Nodes: No mediastinal or hilar adenopathy is seen. There is diffuse enlargement of the thyroid gland suggesting thyroid goiter. Clinical correlation is recommended. Lungs/Pleura: On lung window images, no parenchymal infiltrate is seen. There is no evidence of pleural effusion. No suspicious lung nodule is seen. The central airway is patent. Upper Abdomen: There are artifacts within the upper abdomen in this large patient, but no significant abnormality is seen on the limited views obtained. Musculoskeletal: There are diffuse degenerative changes throughout the thoracic spine. No compression deformity is seen. Review of the MIP images confirms the above  findings. IMPRESSION: 1. Very small scattered nonocclusive pulmonary emboli cannot be excluded in the branches of the pulmonary arteries to the lower lobes. No central embolism is evident and these possible small peripheral emboli are of questionable clinical significance. Venous Doppler of the legs may be helpful. 2. Diffusely enlarged thyroid gland consistent with thyroid goiter. Correlate clinically. 3. Prominent pulmonary arteries trunk most consistent with pulmonary arterial hypertension. Electronically Signed   By: Ivar Drape M.D.   On: 07/11/2016 14:28    EKG: none  Assessment/Plan Acute hypoxic respiratory failure possibly secondary to Acute Pulmonary emboli -presented with dyspnea, fatigue, cough for one week -upon admission, SpO2 was 85%, continue O2 to maintain O2 sats >90% -CXR: Unremarkable for infection,  Port-A-cath noted -CTA chest: Very small scattered nonocclusive pulmonary emboli cannot be excluded in the branches of the pulmonary arteries to the lower lobes. No central embolism is evident and these possible small peripheral emboli of questionable clinical significance -reviewed CTA with Dr. Mortimer Fries, PCCM, V/Q scan may be helpful if dopplers are negative  -ordered DDimer, LE doppler, echocardiogram -Continue heparin drip -Patient does have risk factors for PE, including history of colon cancer, smoking -Continue antitussives for cough  Pulmonary arterial hypertension -Noted on CTA -Echocardiogram ordered  Hyperthyroidism -Continue tapazole  Hemoptysis -Possibly secondary to the above -Continue to monitor  History of colon cancer -Treated with chemotherapy and surgery in 2011, supposedly in remission  Tobacco abuse -Smoking cessation discussed -Refused nicotine patch  Mild leukocytosis -like reactive, continue to monitor  DVT prophylaxis: Heparin  Code Status: Full   Family Communication: None at bedside. Admission, patients condition and plan of care  including tests being ordered have been discussed with the patient, who indicates understanding and agrees with the plan and Code Status.  Disposition Plan: Home when stable.  Consults called: Pulmonary via phone  Admission status: Observation   Time spent: 60 minutes  Vinny Taranto D.O. [facility] Pager [phone]  If 7PM-7AM, please contact night-coverage [email or web address] Password [facility] 07/11/2016, 5:32 PM

## 2016-07-11 NOTE — ED Notes (Signed)
Returned from xray

## 2016-07-12 ENCOUNTER — Observation Stay (HOSPITAL_COMMUNITY): Payer: Medicaid Other

## 2016-07-12 ENCOUNTER — Observation Stay (HOSPITAL_BASED_OUTPATIENT_CLINIC_OR_DEPARTMENT_OTHER): Payer: Medicaid Other

## 2016-07-12 DIAGNOSIS — I2699 Other pulmonary embolism without acute cor pulmonale: Secondary | ICD-10-CM | POA: Diagnosis not present

## 2016-07-12 LAB — BASIC METABOLIC PANEL
ANION GAP: 7 (ref 5–15)
BUN: 9 mg/dL (ref 6–20)
CHLORIDE: 107 mmol/L (ref 101–111)
CO2: 23 mmol/L (ref 22–32)
Calcium: 8 mg/dL — ABNORMAL LOW (ref 8.9–10.3)
Creatinine, Ser: 0.42 mg/dL — ABNORMAL LOW (ref 0.44–1.00)
GFR calc Af Amer: >60 mL/min (ref >60)
GFR calc non Af Amer: >60 mL/min (ref >60)
Glucose, Bld: 108 mg/dL — ABNORMAL HIGH (ref 65–99)
Potassium: 4.1 mmol/L (ref 3.5–5.1)
Sodium: 137 mmol/L (ref 135–145)

## 2016-07-12 LAB — ECHOCARDIOGRAM COMPLETE
CHL CUP MV DEC (S): 313
EERAT: 8.58
EWDT: 313 ms
FS: 47 % — AB (ref 28–44)
Height: 61 in
IV/PV OW: 0.88
LA ID, A-P, ES: 33 mm
LA diam end sys: 33 mm
LA vol index: 30.8 mL/m2
LADIAMINDEX: 1.6 cm/m2
LAVOL: 63.5 mL
LAVOLA4C: 54.7 mL
LV PW d: 10.3 mm — AB (ref 0.6–1.1)
LV TDI E'LATERAL: 12.7
LV dias vol: 77 mL (ref 46–106)
LV e' LATERAL: 12.7 cm/s
LV sys vol index: 12 mL/m2
LVDIAVOLIN: 38 mL/m2
LVEEAVG: 8.58
LVEEMED: 8.58
LVOT SV: 88 mL
LVOT VTI: 34.6 cm
LVOT area: 2.54 cm2
LVOT diameter: 18 mm
LVOT peak grad rest: 11 mmHg
LVOT peak vel: 167 cm/s
LVSYSVOL: 26 mL (ref 14–42)
MVPG: 5 mmHg
MVPKAVEL: 100 m/s
MVPKEVEL: 109 m/s
RV LATERAL S' VELOCITY: 19.7 cm/s
RV TAPSE: 30 mm
RV sys press: 37 mmHg
Reg peak vel: 270 cm/s
Simpson's disk: 67
Stroke v: 52 ml
TDI e' medial: 7.07
TR max vel: 270 cm/s
Weight: 3316.8 oz

## 2016-07-12 LAB — CBC
HCT: 37.7 % (ref 36.0–46.0)
Hemoglobin: 12 g/dL (ref 12.0–15.0)
MCH: 28.7 pg (ref 26.0–34.0)
MCHC: 31.8 g/dL (ref 30.0–36.0)
MCV: 90.2 fL (ref 78.0–100.0)
Platelets: 301 10*3/uL (ref 150–400)
RBC: 4.18 MIL/uL (ref 3.87–5.11)
RDW: 14.4 % (ref 11.5–15.5)
WBC: 9.6 10*3/uL (ref 4.0–10.5)

## 2016-07-12 LAB — HEPARIN LEVEL (UNFRACTIONATED): Heparin Unfractionated: 0.26 IU/mL — ABNORMAL LOW (ref 0.30–0.70)

## 2016-07-12 LAB — HIV ANTIBODY (ROUTINE TESTING W REFLEX): HIV Screen 4th Generation wRfx: NONREACTIVE

## 2016-07-12 MED ORDER — RIVAROXABAN (XARELTO) EDUCATION KIT FOR DVT/PE PATIENTS
PACK | Freq: Once | Status: AC
  Administered 2016-07-12: 12:00:00
  Filled 2016-07-12: qty 1

## 2016-07-12 MED ORDER — RIVAROXABAN (XARELTO) VTE STARTER PACK (15 & 20 MG): ORAL_TABLET | ORAL | 0 refills | Status: DC

## 2016-07-12 MED ORDER — RIVAROXABAN 15 MG PO TABS
15.0000 mg | ORAL_TABLET | Freq: Two times a day (BID) | ORAL | Status: DC
  Administered 2016-07-12: 15 mg via ORAL
  Filled 2016-07-12: qty 1

## 2016-07-12 MED ORDER — RIVAROXABAN (XARELTO) VTE STARTER PACK (15 & 20 MG): ORAL_TABLET | ORAL | 0 refills | Status: AC

## 2016-07-12 NOTE — Discharge Instructions (Signed)
Pulmonary Embolism A pulmonary embolism (PE) is a sudden blockage or decrease of blood flow in one lung or both lungs. Most blockages come from a blood clot that travels from the legs or the pelvis to the lungs. PE is a dangerous and potentially life-threatening condition if it is not treated right away. What are the causes? A pulmonary embolism occurs most commonly when a blood clot travels from one of your veins to your lungs. Rarely, PE is caused by air, fat, amniotic fluid, or part of a tumor traveling through your veins to your lungs. What increases the risk? A PE is more likely to develop in:  People who smoke.  People who areolder, especially over 31 years of age.  People who are overweight (obese).  People who sit or lie still for a long time, such as during long-distance travel (over 4 hours), bed rest, hospitalization, or during recovery from certain medical conditions like a stroke.  People who do not engage in much physical activity (sedentary lifestyle).  People who have chronic breathing disorders.  People whohave a personal or family history of blood clots or blood clotting disease.  People whohave peripheral vascular disease (PVD), diabetes, or some types of cancer.  People who haveheart disease, especially if the person had a recent heart attack or has congestive heart failure.  People who have neurological diseases that affect the legs (leg paresis).  People who have had a traumatic injury, such as breaking a hip or leg.  People whohave recently had major or lengthy surgery, especially on the hip, knee, or abdomen.  People who have hada central line placed inside a large vein.  People who takemedicines that contain the hormone estrogen. These include birth control pills and hormone replacement therapy.  Pregnancy or during childbirth or the postpartum period. What are the signs or symptoms? The symptoms of a PE usually start suddenly and  include:  Shortness of breath while active or at rest.  Coughing or coughing up blood or blood-tinged mucus.  Chest pain that is often worse with deep breaths.  Rapid or irregular heartbeat.  Feeling light-headed or dizzy.  Fainting.  Feelinganxious.  Sweating. There may also be pain and swelling in a leg if that is where the blood clot started. These symptoms may represent a serious problem that is an emergency. Do not wait to see if the symptoms will go away. Get medical help right away. Call your local emergency services (911 in the [place] Do not drive yourself to the hospital.  How is this diagnosed? Your health care provider will take a medical history and perform a physical exam. You may also have other tests, including:  Blood tests to assess the clotting properties of your blood, assess oxygen levels in your blood, and find blood clots.  Imaging tests, such as CT, ultrasound, MRI, X-ray, and other tests to see if you have clots anywhere in your body.  An electrocardiogram (ECG) to look for heart strain from blood clots in the lungs. How is this treated? The main goals of PE treatment are:  To stop a blood clot from growing larger.  To stop new blood clots from forming. The type of treatment that you receive depends on many factors, such as the cause of your PE, your risk for bleeding or developing more clots, and other medical conditions that you have. Sometimes, a combination of treatments is necessary. This condition may be treated with:  Medicines, including newer oral blood thinners (anticoagulants), warfarin, low  molecular weight heparins, thrombolytics, or heparins.  Wearing compression stockings or using different types of devices.  Surgery (rare) to remove the blood clot or to place a filter in your abdomen to stop the blood clot from traveling to your lungs. Treatments for a PE are often divided into immediate treatment, long-term treatment (up to 3 months  after PE), and extended treatment (more than 3 months after PE). Your treatment may continue for several months. This is called maintenance therapy, and it is used to prevent the forming of new blood clots. You can work with your health care provider to choose the treatment program that is best for you. What are anticoagulants?  Anticoagulants are medicines that treat PEs. They can stop current blood clots from growing and stop new clots from forming. They cannot dissolve existing clots. Your body dissolves clots by itself over time. Anticoagulants are given by mouth, by injection, or through an IV tube. What are thrombolytics?  Thrombolytics are clot-dissolving medicines that are used to dissolve a PE. They carry a high risk of bleeding, so they tend to be used only in severe cases or if you have very low blood pressure. Follow these instructions at home: If you are taking a newer oral anticoagulant:   Take the medicine every single day at the same time each day.  Understand what foods and drugs interact with this medicine.  Understand that there are no regular blood tests required when using this medicine.  Understandthe side effects of this medicine, including excessive bruising or bleeding. Ask your health care provider or pharmacist about other possible side effects. If you are taking warfarin:   Understand how to take warfarin and know which foods can affect how warfarin works in Veterinary surgeon.  Understand that it is dangerous to taketoo much or too little warfarin. Too much warfarin increases the risk of bleeding. Too little warfarin continues to allow the risk for blood clots.  Follow your PT and INR blood testing schedule. The PT and INR results allow your health care provider to adjust your dose of warfarin. It is very important that you have your PT and INR tested as often as told by your health care provider.  Avoid major changes in your diet, or tell your health care provider before  you change your diet. Arrange a visit with a registered dietitian to answer your questions. Many foods, especially foods that are high in vitamin K, can interfere with warfarin and affect the PT and INR results. Eat a consistent amount of foods that are high in vitamin K, such as:  Spinach, kale, broccoli, cabbage, collard greens, turnip greens, Brussels sprouts, peas, cauliflower, seaweed, and parsley.  Beef liver and pork liver.  Green tea.  Soybean oil.  Tell your health care provider about any and all medicines, vitamins, and supplements that you take, including aspirin and other over-the-counter anti-inflammatory medicines. Be especially cautious with aspirin and anti-inflammatory medicines. Do not take those before you ask your health care provider if it is safe to do so. This is important because many medicines can interfere with warfarin and affect the PT and INR results.  Do not start or stop taking any over-the-counter or prescription medicine unless your health care provider or pharmacist tells you to do so. If you take warfarin, you will also need to do these things:  Hold pressure over cuts for longer than usual.  Tell your dentist and other health care providers that you are taking warfarin before you have  any procedures in which bleeding may occur.  Avoid alcohol or drink very small amounts. Tell your health care provider if you change your alcohol intake.  Do not use tobacco products, including cigarettes, chewing tobacco, and e-cigarettes. If you need help quitting, ask your health care provider.  Avoid contact sports. General instructions   Take over-the-counter and prescription medicines only as told by your health care provider. Anticoagulant medicines can have side effects, including easy bruising and difficulty stopping bleeding. If you are prescribed an anticoagulant, you will also need to do these things:  Hold pressure over cuts for longer than usual.  Tell your  dentist and other health care providers that you are taking anticoagulants before you have any procedures in which bleeding may occur.  Avoid contact sports.  Wear a medical alert bracelet or carry a medical alert card that says you have had a PE.  Ask your health care provider how soon you can go back to your normal activities. Stay active to prevent new blood clots from forming.  Make sure to exercise while traveling or when you have been sitting or standing for a long period of time. It is very important to exercise. Exercise your legs by walking or by tightening and relaxing your leg muscles often. Take frequent walks.  Wear compression stockings as told by your health care provider to help prevent more blood clots from forming.  Do not use tobacco products, including cigarettes, chewing tobacco, and e-cigarettes. If you need help quitting, ask your health care provider.  Keep all follow-up appointments with your health care provider. This is important. How is this prevented? Take these actions to decrease your risk of developing another PE:  Exercise regularly. For at least 30 minutes every day, engage in:  Activity that involves moving your arms and legs.  Activity that encourages good blood flow through your body by increasing your heart rate.  Exercise your arms and legs every hour during long-distance travel (over 4 hours). Drink plenty of water and avoid drinking alcohol while traveling.  Avoid sitting or lying in bed for long periods of time without moving your legs.  Maintain a weight that is appropriate for your height. Ask your health care provider what weight is healthy for you.  If you are a woman who is over 69 years of age, avoid unnecessary use of medicines that contain estrogen. These include birth control pills.  Do not smoke, especially if you take estrogen medicines. If you need help quitting, ask your health care provider.  If you are at very high risk for  PE, wear compression stockings.  If you recently had a PE, have regularly scheduled ultrasound testing on your legs to check for new blood clots. If you are hospitalized, prevention measures may include:  Early walking after surgery, as soon as your health care provider says that it is safe.  Receiving anticoagulants to prevent blood clots. If you cannot take anticoagulants, other options may be available, such as wearing compression stockings or using different types of devices. Get help right away if:  You have new or increased pain, swelling, or redness in an arm or leg.  You have numbness or tingling in an arm or leg.  You have shortness of breath while active or at rest.  You have chest pain.  You have a rapid or irregular heartbeat.  You feel light-headed or dizzy.  You cough up blood.  You notice blood in your vomit, bowel movement, or urine.  You have a fever. These symptoms may represent a serious problem that is an emergency. Do not wait to see if the symptoms will go away. Get medical help right away. Call your local emergency services (911 in the [place] Do not drive yourself to the hospital. This information is not intended to replace advice given to you by your health care provider. Make sure you discuss any questions you have with your health care provider. Document Released: 03/31/2000 Document Revised: 09/09/2015 Document Reviewed: 07/29/2014 Elsevier Interactive Patient Education  2017 [place]. Rivaroxaban oral tablets What is this medicine? RIVAROXABAN (ri va ROX a ban) is an anticoagulant (blood thinner). It is used to treat blood clots in the lungs or in the veins. It is also used after knee or hip surgeries to prevent blood clots. It is also used to lower the chance of stroke in people with a medical condition called atrial fibrillation. This medicine may be used for other purposes; ask your health care provider or pharmacist if you have questions. COMMON  BRAND NAME(S): Xarelto, Xarelto Starter Pack What should I tell my health care provider before I take this medicine? They need to know if you have any of these conditions: -bleeding disorders -bleeding in the brain -blood in your stools (black or tarry stools) or if you have blood in your vomit -history of stomach bleeding -kidney disease -liver disease -low blood counts, like low white cell, platelet, or red cell counts -recent or planned spinal or epidural procedure -take medicines that treat or prevent blood clots -an unusual or allergic reaction to rivaroxaban, other medicines, foods, dyes, or preservatives -pregnant or trying to get pregnant -breast-feeding How should I use this medicine? Take this medicine by mouth with a glass of water. Follow the directions on the prescription label. Take your medicine at regular intervals. Do not take it more often than directed. Do not stop taking except on your doctor's advice. Stopping this medicine may increase your risk of a blood clot. Be sure to refill your prescription before you run out of medicine. If you are taking this medicine after hip or knee replacement surgery, take it with or without food. If you are taking this medicine for atrial fibrillation, take it with your evening meal. If you are taking this medicine to treat blood clots, take it with food at the same time each day. If you are unable to swallow your tablet, you may crush the tablet and mix it in applesauce. Then, immediately eat the applesauce. You should eat more food right after you eat the applesauce containing the crushed tablet. Talk to your pediatrician regarding the use of this medicine in children. Special care may be needed. Overdosage: If you think you have taken too much of this medicine contact a poison control center or emergency room at once. NOTE: This medicine is only for you. Do not share this medicine with others. What if I miss a dose? If you take your  medicine once a day and miss a dose, take the missed dose as soon as you remember. If you take your medicine twice a day and miss a dose, take the missed dose immediately. In this instance, 2 tablets may be taken at the same time. The next day you should take 1 tablet twice a day as directed. What may interact with this medicine? Do not take this medicine with any of the following medications: -defibrotide This medicine may also interact with the following medications: -aspirin and aspirin-like medicines -certain  antibiotics like erythromycin, azithromycin, and clarithromycin -certain medicines for fungal infections like ketoconazole and itraconazole -certain medicines for irregular heart beat like amiodarone, quinidine, dronedarone -certain medicines for seizures like carbamazepine, phenytoin -certain medicines that treat or prevent blood clots like warfarin, enoxaparin, and dalteparin -conivaptan -diltiazem -felodipine -indinavir -lopinavir; ritonavir -NSAIDS, medicines for pain and inflammation, like ibuprofen or naproxen -ranolazine -rifampin -ritonavir -SNRIs, medicines for depression, like desvenlafaxine, duloxetine, levomilnacipran, venlafaxine -SSRIs, medicines for depression, like citalopram, escitalopram, fluoxetine, fluvoxamine, paroxetine, sertraline -St. John's wort -verapamil This list may not describe all possible interactions. Give your health care provider a list of all the medicines, herbs, non-prescription drugs, or dietary supplements you use. Also tell them if you smoke, drink alcohol, or use illegal drugs. Some items may interact with your medicine. What should I watch for while using this medicine? Visit your doctor or health care professional for regular checks on your progress. Notify your doctor or health care professional and seek emergency treatment if you develop breathing problems; changes in vision; chest pain; severe, sudden headache; pain, swelling, warmth  in the leg; trouble speaking; sudden numbness or weakness of the face, arm or leg. These can be signs that your condition has gotten worse. If you are going to have surgery or other procedure, tell your doctor that you are taking this medicine. What side effects may I notice from receiving this medicine? Side effects that you should report to your doctor or health care professional as soon as possible: -allergic reactions like skin rash, itching or hives, swelling of the face, lips, or tongue -back pain -redness, blistering, peeling or loosening of the skin, including inside the mouth -signs and symptoms of bleeding such as bloody or black, tarry stools; red or dark-brown urine; spitting up blood or brown material that looks like coffee grounds; red spots on the skin; unusual bruising or bleeding from the eye, gums, or nose Side effects that usually do not require medical attention (report to your doctor or health care professional if they continue or are bothersome): -dizziness -muscle pain This list may not describe all possible side effects. Call your doctor for medical advice about side effects. You may report side effects to FDA at 1-800-FDA-1088. Where should I keep my medicine? Keep out of the reach of children. Store at room temperature between 15 and 30 degrees C (59 and 86 degrees F). Throw away any unused medicine after the expiration date. NOTE: This sheet is a summary. It may not cover all possible information. If you have questions about this medicine, talk to your doctor, pharmacist, or health care provider.  2018 Elsevier/[employer] Standard (2015-12-22 16:29:33)

## 2016-07-12 NOTE — Discharge Summary (Signed)
Physician Discharge Summary  Susan Pearson JJK:[id number] DOB: [birth year] DOA: 07/11/2016  PCP: No PCP Per Patient  Admit date: 07/11/2016 Discharge date: 07/12/2016  Recommendations for Outpatient Follow-up:  1. Pt will need to follow up with PCP in 2-3 weeks post discharge 2. Please obtain BMP to evaluate electrolytes and kidney function 3. Please also check CBC to evaluate Hg and Hct levels 4. Please note that pt insisted on going home and did not want to wait for LE dopper 5. Script for Xarelto given   Discharge Diagnoses:  Active Problems:   Shortness of breath   Hyperthyroidism   Cough  Discharge Condition: Stable  Diet recommendation: Heart healthy diet discussed in details   History of present illness:   61 y.o. female with a medical history of colon cancer, hyperthyroidism, who presented to the emergency department with complaints of cough, fatigue, shortness of breath for 1 week. Patient also endorsed coughing up dark brown phlegm one day prior to the admission.   ED Course: Found to have questionable PE on CTA chest, started on heparin. Was hypoxic. TRH called for admission.   Hospital Course:  Assessment/Plan Acute hypoxic respiratory failure possibly secondary to Acute Pulmonary emboli -presented with dyspnea, fatigue, cough for one week -upon admission, SpO2 was 85%, continue O2 to maintain O2 sats >90% -CXR: Unremarkable for infection,  Port-A-cath noted -CTA chest: Very small scattered nonocclusive pulmonary emboli cannot be excluded in the branches of the pulmonary arteries to the lower lobes. No central embolism is evident and these possible small peripheral emboli of questionable clinical significance -Dr Ree Kida reviewed CTA with Dr. Mortimer Fries, PCCM, V/Q scan may be helpful if dopplers are negative  -pt however, declined further testing and wants to go home asap -script for Xarelto given   Pulmonary arterial hypertension -Noted on CTA -Echocardiogram  ordered, pending results on discharge   Hyperthyroidism -Continue tapazole  Hemoptysis -Possibly secondary to the above -no further testing per pt request, leaving today   History of colon cancer -Treated with chemotherapy and surgery in 2011, supposedly in remission  Tobacco abuse -Smoking cessation discussed -Refused nicotine patch  Mild leukocytosis -like reactive, resolved   DVT prophylaxis: Heparin  Code Status: Full   Family Communication: None at bedside.   Disposition Plan: Home   Consults called: Pulmonary via phone by Dr. Ree Kida    Procedures/Studies: Dg Chest 2 View  Result Date: 07/11/2016 CLINICAL DATA:  Cough for several days, chest discomfort on deep inspiration, history of carcinoma of the colon, smoking history EXAM: CHEST  2 VIEW COMPARISON:  None. FINDINGS: No active infiltrate or effusion is seen. No metastatic involvement of the lungs is noted. Mediastinal and hilar contours are unremarkable. The heart is mildly enlarged. A right-sided Port-A-Cath is present with the tip seen to the mid upper SVC. There are degenerative changes in the mid lower thoracic spine. IMPRESSION: 1. No active lung disease.  No evidence of metastases to the lungs. 2. Port-A-Cath tip overlies the mid upper SVC Electronically Signed   By: Ivar Drape M.D.   On: 07/11/2016 10:22   Ct Angio Chest Pe W And/or Wo Contrast  Result Date: 07/11/2016 CLINICAL DATA:  Persistent cough for 5 days, hemoptysis, shortness of breath EXAM: CT ANGIOGRAPHY CHEST WITH CONTRAST TECHNIQUE: Multidetector CT imaging of the chest was performed using the standard protocol during bolus administration of intravenous contrast. Multiplanar CT image reconstructions and MIPs were obtained to evaluate the vascular anatomy. CONTRAST:  80 cc Isovue 370  COMPARISON:  Chest x-ray of 07/11/2016 FINDINGS: Cardiovascular: The pulmonary arteries are relatively well opacified. No central pulmonary embolus is seen.  The pulmonary arterial trunk is prominent however suggesting a degree of pulmonary arterial hypertension. Within the smaller branches of the lower lobe pulmonary arteries, a few very small nonocclusive pulmonary emboli cannot be excluded. If these small defects actually represent small pulmonary emboli then they would be of questionable clinical significance. A venous Doppler ultrasound of the legs may be helpful to exclude thrombus. The heart is mildly enlarged. No pericardial effusion is seen. The mid ascending thoracic aorta measures 37 mm in diameter. Mediastinum/Nodes: No mediastinal or hilar adenopathy is seen. There is diffuse enlargement of the thyroid gland suggesting thyroid goiter. Clinical correlation is recommended. Lungs/Pleura: On lung window images, no parenchymal infiltrate is seen. There is no evidence of pleural effusion. No suspicious lung nodule is seen. The central airway is patent. Upper Abdomen: There are artifacts within the upper abdomen in this large patient, but no significant abnormality is seen on the limited views obtained. Musculoskeletal: There are diffuse degenerative changes throughout the thoracic spine. No compression deformity is seen. Review of the MIP images confirms the above findings. IMPRESSION: 1. Very small scattered nonocclusive pulmonary emboli cannot be excluded in the branches of the pulmonary arteries to the lower lobes. No central embolism is evident and these possible small peripheral emboli are of questionable clinical significance. Venous Doppler of the legs may be helpful. 2. Diffusely enlarged thyroid gland consistent with thyroid goiter. Correlate clinically. 3. Prominent pulmonary arteries trunk most consistent with pulmonary arterial hypertension. Electronically Signed   By: Ivar Drape M.D.   On: 07/11/2016 14:28   Discharge Exam: Vitals:   07/12/16 0858 07/12/16 1230  BP: 126/67 (!) 101/51  Pulse: 70 64  Resp: 20 20  Temp: 98.7 F (37.1 C) 98.5 F  (36.9 C)   Vitals:   07/12/16 0435 07/12/16 0557 07/12/16 0858 07/12/16 1230  BP: (!) 121/47  126/67 (!) 101/51  Pulse: 75  70 64  Resp: 20  20 20   Temp: 98.1 F (36.7 C)  98.7 F (37.1 C) 98.5 F (36.9 C)  TempSrc: Oral  Oral Oral  SpO2: 92%  98% 98%  Weight:  94 kg (207 lb 4.8 oz)    Height:        General: Pt is alert, follows commands appropriately, not in acute distress Cardiovascular: Regular rate and rhythm, S1/S2 +, no murmurs, no rubs, no gallops Respiratory: Clear to auscultation bilaterally, no wheezing, no crackles, no rhonchi Abdominal: Soft, non tender, non distended, bowel sounds +, no guarding   Discharge Instructions  Discharge Instructions    Diet - low sodium heart healthy    Complete by:  As directed    Increase activity slowly    Complete by:  As directed      Allergies as of 07/12/2016   No Known Allergies     Medication List    TAKE these medications   acetaminophen 500 MG tablet Commonly known as:  TYLENOL Take 500 mg by mouth every 6 (six) hours as needed for mild pain.   atenolol 50 MG tablet Commonly known as:  TENORMIN Take 25 mg by mouth daily.   guaifenesin 100 MG/5ML syrup Commonly known as:  ROBITUSSIN Take 200 mg by mouth 3 (three) times daily as needed for cough.   methimazole 10 MG tablet Commonly known as:  TAPAZOLE Take 20 mg by mouth daily.   Rivaroxaban 15 &  20 MG Tbpk Take as directed on package: Start with one 15mg  tablet by mouth twice a day with food. On Day 22, switch to one 20mg  tablet once a day with food.      Follow-up Information    [facility].   Contact information: [address] [place] [address] [phone]           The results of significant diagnostics from this hospitalization (including imaging, microbiology, ancillary and laboratory) are listed below for reference.     Microbiology: No results found for this or any previous  visit (from the past 240 hour(s)).   Labs: Basic Metabolic Panel:  Recent Labs Lab 07/11/16 1344 07/11/16 1522 07/12/16 0535  NA 141 137 137  K 3.7 4.3 4.1  CL 101 105 107  CO2  --  24 23  GLUCOSE 118* 95 108*  BUN 4* 5* 9  CREATININE 0.40* 0.42* 0.42*  CALCIUM  --  8.4* 8.0*   Liver Function Tests:  Recent Labs Lab 07/11/16 1522  AST 25  ALT 15  ALKPHOS 165*  BILITOT 1.0  PROT 7.2  ALBUMIN 3.0*   CBC:  Recent Labs Lab 07/11/16 1344 07/11/16 1522 07/12/16 0535  WBC  --  11.8* 9.6  NEUTROABS  --  7.9*  --   HGB 13.6 13.1 12.0  HCT 40.0 39.7 37.7  MCV  --  89.8 90.2  PLT  --  364 301    BNP (last 3 results)  Recent Labs  07/11/16 1640  BNP 130.6*    SIGNED: Time coordinating discharge: 30 minutes  Faye Ramsay, MD  [facility] 07/12/2016, 2:21 PM Pager [phone]  If 7PM-7AM, please contact night-coverage [email or web address] Password [email or web address]

## 2016-07-12 NOTE — Progress Notes (Signed)
ANTICOAGULATION CONSULT NOTE - Follow Up Consult  Pharmacy Consult for Heparin->Xarelto Indication: pulmonary embolus  No Known Allergies  Patient Measurements: Height: 5\' 1"  (154.9 cm) Weight: 207 lb 4.8 oz (94 kg) (scale a) IBW/kg (Calculated) : 47.8 Heparin Dosing Weight: 66 kg  Vital Signs: Temp: 98.7 F (37.1 C) (03/28 0858) Temp Source: Oral (03/28 0858) BP: 126/67 (03/28 0858) Pulse Rate: 70 (03/28 0858)  Labs:  Recent Labs  07/11/16 1344 07/11/16 1522 07/11/16 2158 07/12/16 0535  HGB 13.6 13.1  --  12.0  HCT 40.0 39.7  --  37.7  PLT  --  364  --  301  HEPARINUNFRC  --   --  0.25* 0.26*  CREATININE 0.40* 0.42*  --  0.42*    Estimated Creatinine Clearance: 79.2 mL/min (A) (by C-G formula based on SCr of 0.42 mg/dL (L)).  Assessment:  59 yof presented to the ED on 07/11/16 with cough and chest pain. Found to have small non-occlusive PE. IV heparin begun 3/27 pm.  Heparin levels have been subtherapeutic x 2 and drip rate has been increased twice.  To change to Xarelto this morning.  Goal of Therapy:  Heparin level 0.3-0.7 units/ml therapeutic anticoagulation with Xarelto Monitor platelets by anticoagulation protocol: Yes   Plan:   Xarelto 15 mg BID x 21 days, then 20 mg daily with supper.  Stop IV heparin when giving first Xarelto dose.  Arty Baumgartner, [place] Pager: [phone] 07/12/2016,10:34 AM

## 2016-07-12 NOTE — Progress Notes (Signed)
Pt has orders to be discharged. Discharge instructions given and pt has no additional questions at this time. Medication regimen reviewed and pt educated. Pt verbalized understanding and has no additional questions. Telemetry box removed. IV removed and site in good condition. Pt stable and waiting for transportation.   Lacara Dunsworth RN 

## 2016-07-12 NOTE — Progress Notes (Signed)
Pt angry and pacing halls stating she wants to leave AMA. Pt angry about waiting for LE dopplers to be completed. Dr. Olen Pel paged. Pt will be discharged.

## 2016-07-12 NOTE — Progress Notes (Signed)
Increased hep drip  rate to 12.40ml/hr

## 2016-07-12 NOTE — Progress Notes (Signed)
*  PRELIMINARY RESULTS* Echocardiogram 2D Echocardiogram has been performed.  Samuel Germany 07/12/2016, 10:23 AM

## 2016-07-12 NOTE — Progress Notes (Addendum)
Pt continues to cough with production of tan colored sputum and drainage from nares. with headache and chest wall pain gave prn tylenol for pain. Not time for cough syrup.

## 2016-07-12 NOTE — Progress Notes (Signed)
ANTICOAGULATION CONSULT NOTE - Follow Up Consult  Pharmacy Consult for Heparin  Indication: pulmonary embolus  No Known Allergies  Patient Measurements: Height: 5\' 1"  (154.9 cm) Weight: 207 lb 4.8 oz (94 kg) (scale a) IBW/kg (Calculated) : 47.8  Vital Signs: Temp: 98.1 F (36.7 C) (03/28 0435) Temp Source: Oral (03/28 0435) BP: 121/47 (03/28 0435) Pulse Rate: 75 (03/28 0435)  Labs:  Recent Labs  07/11/16 1344 07/11/16 1522 07/11/16 2158 07/12/16 0535  HGB 13.6 13.1  --  12.0  HCT 40.0 39.7  --  37.7  PLT  --  364  --  301  HEPARINUNFRC  --   --  0.25* 0.26*  CREATININE 0.40* 0.42*  --  0.42*    Estimated Creatinine Clearance: 79.2 mL/min (A) (by C-G formula based on SCr of 0.42 mg/dL (L)).  Assessment: Sub-therapeutic heparin level despite rate increase, no issues per RN.   Goal of Therapy:  Heparin level 0.3-0.7 units/ml Monitor platelets by anticoagulation protocol: Yes   Plan:  -Inc heparin to 1450 units/hr -1500 HL  Narda Bonds 07/12/2016,6:44 AM

## 2016-07-12 NOTE — Care Management Note (Signed)
Case Management Note  Patient Details  Name: Susan Pearson MRN: [id number] Date of Birth: 07/27/1955  Subjective/Objective:      Admitted with PE              Action/Plan: Patient is independent of all of her ADL's,up ambulating hallway; Financial Advisor talked to patient this am; she has applied for Medicaid and Disability; No PCP, follow up hospital visit to be made at the [facility]; Xarelto coupon card given to the patient. CM encouraged pt complete paperwork for ongoing medication assistance; she is in a hurry to go home today.   Expected Discharge Date:  07/13/16               Expected Discharge Plan:  Home/Self Care  Discharge planning Services  CM Consult  Status of Service:  Completed, signed off  Sherrilyn Rist [phone] 07/12/2016, 2:16 PM

## 2018-11-18 IMAGING — CT CT ANGIO CHEST
2 of 6 series · 18 of 36 positions shown · IV contrast (Omni 300)
Comparison: Chest x-ray of 07/11/2016

CLINICAL DATA: Persistent cough for 5 days, hemoptysis, shortness
of breath

EXAM:
CT ANGIOGRAPHY CHEST WITH CONTRAST
TECHNIQUE: Multidetector CT imaging of the chest was performed using the
standard protocol during bolus administration of intravenous
contrast. Multiplanar CT image reconstructions and MIPs were
obtained to evaluate the vascular anatomy.
CONTRAST:  80 cc Isovue 370

[Series 7: pe thins · axial · 0.58mm/px · z∈[-508,-262]mm · 17 of 278 slices shown]
[im 16/278  lung]
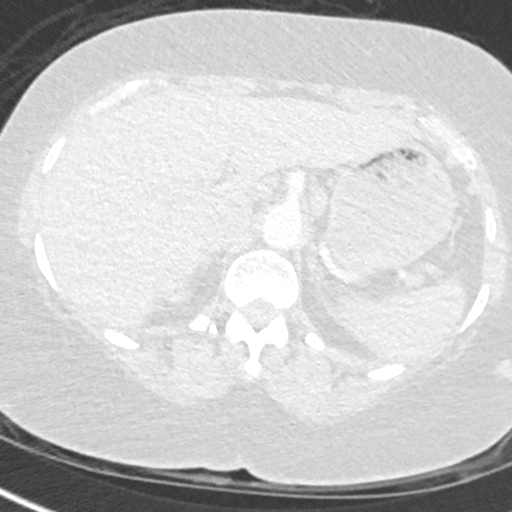
[im 31/278  mediastinal]
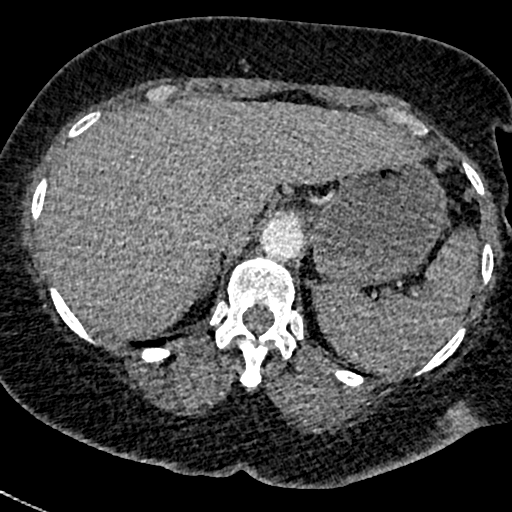
[im 47/278  lung]
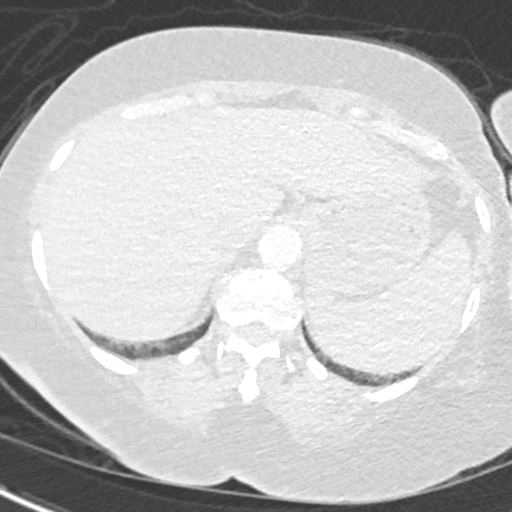
[im 62/278  mediastinal]
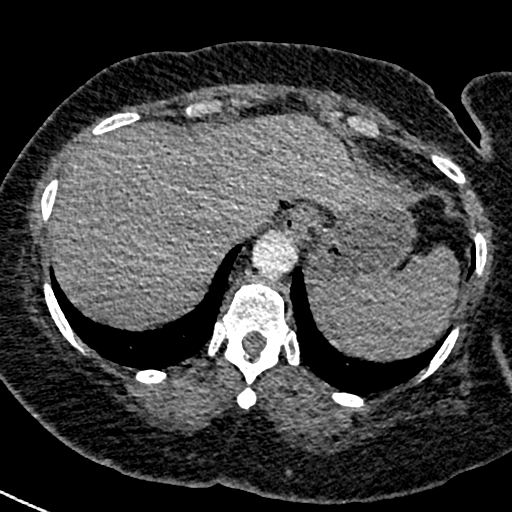
[im 77/278  lung]
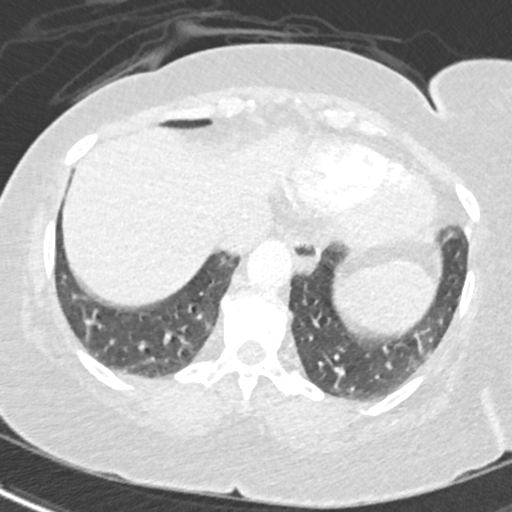
[im 93/278  mediastinal]
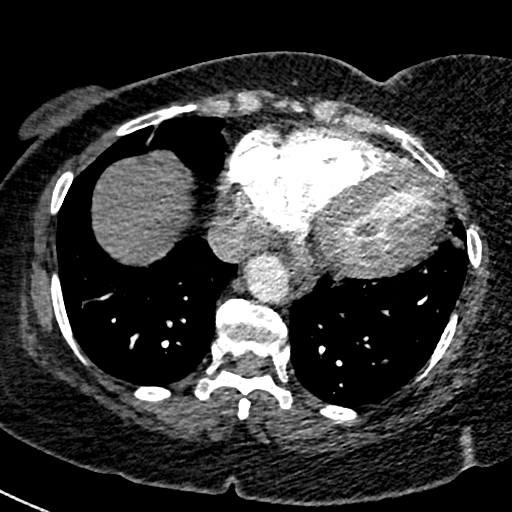
[im 108/278  lung]
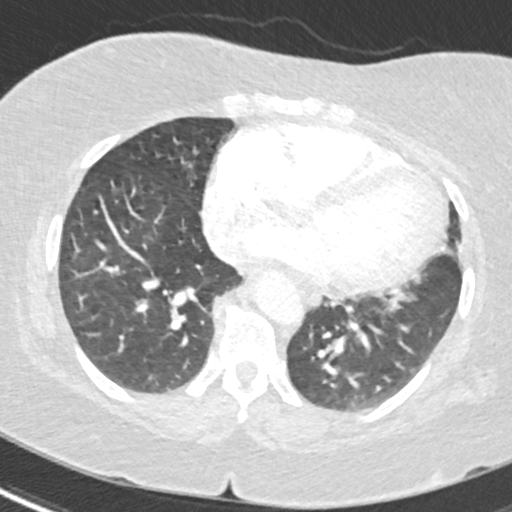
[im 124/278  mediastinal]
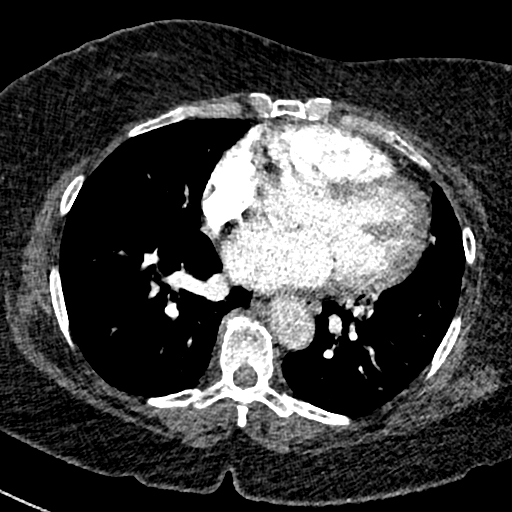
[im 139/278  lung]
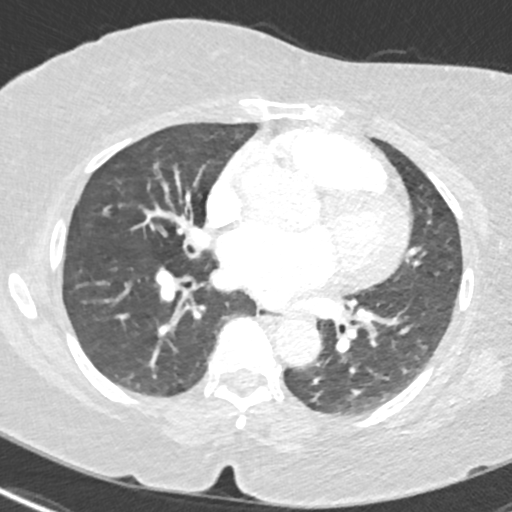
[im 154/278  mediastinal]
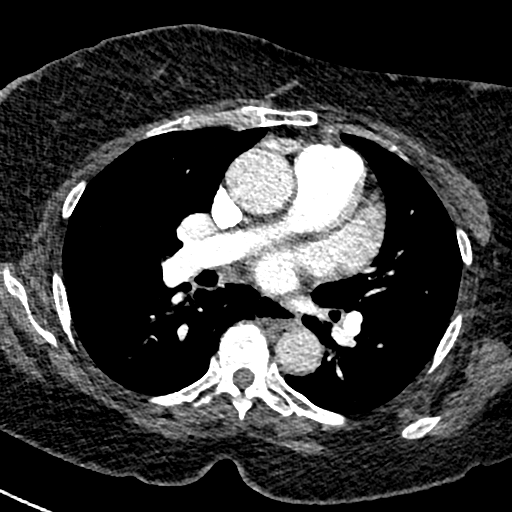
[im 170/278  lung]
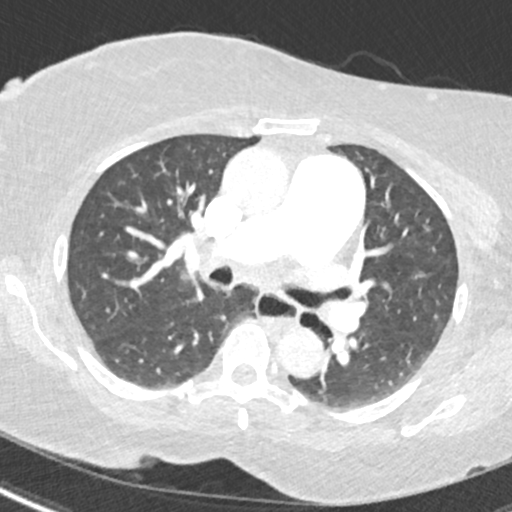
[im 185/278  mediastinal]
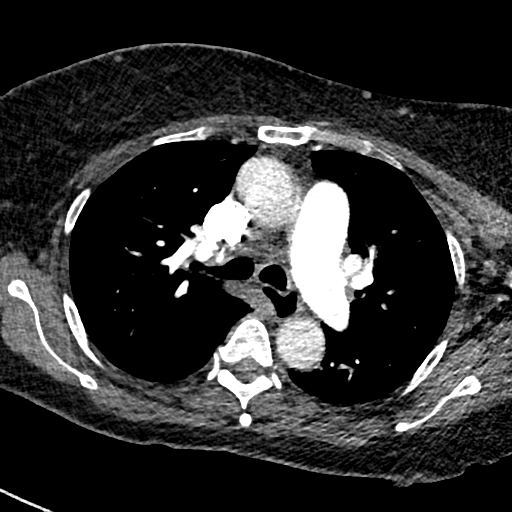
[im 201/278  lung]
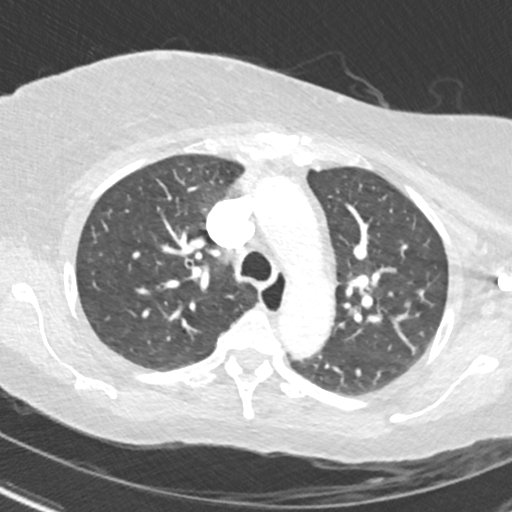
[im 216/278  mediastinal]
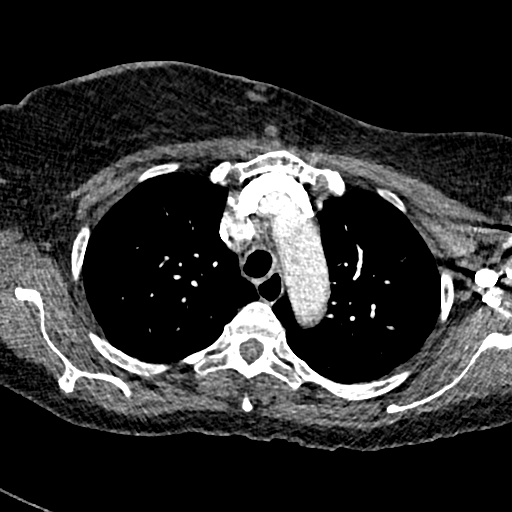
[im 231/278  lung]
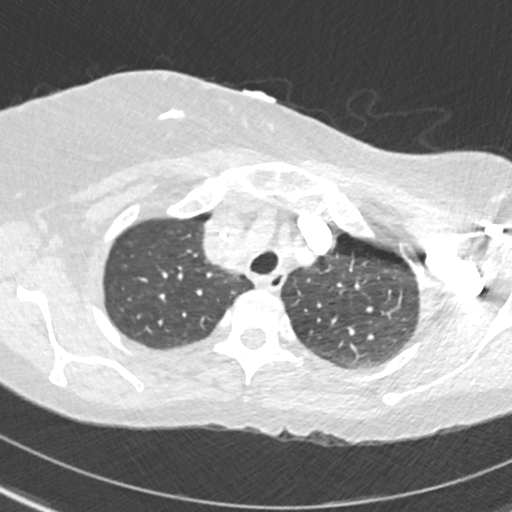
[im 247/278  mediastinal]
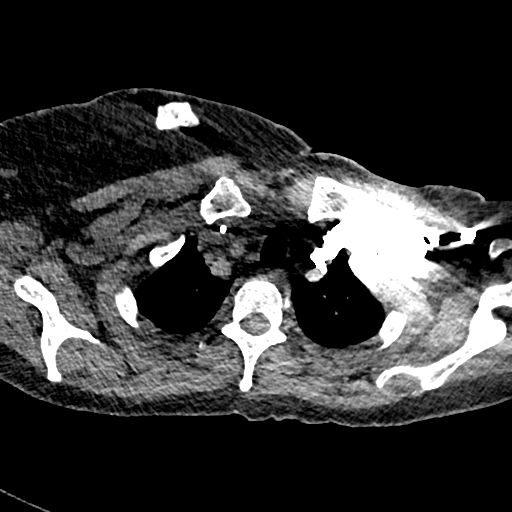
[im 262/278  lung]
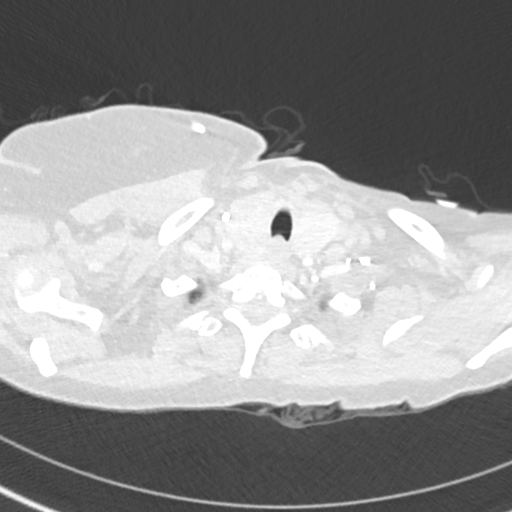

[Series 8: pe 2mm cor · coronal · 0.55mm/px · 1 of 98 slices shown]
[im 49/98  mediastinal]
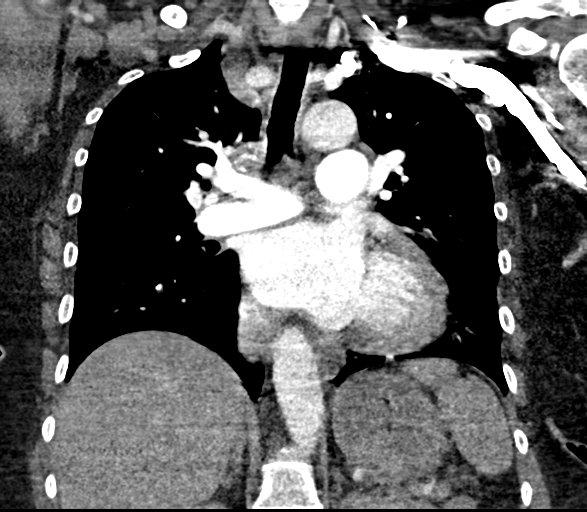

[18 of 36 positions shown; findings below may reference images not displayed]

FINDINGS: Cardiovascular: The pulmonary arteries are relatively well
opacified. No central pulmonary embolus is seen. The pulmonary
arterial trunk is prominent however suggesting a degree of pulmonary
arterial hypertension. Within the smaller branches of the lower lobe
pulmonary arteries, a few very small nonocclusive pulmonary emboli
cannot be excluded. If these small defects actually represent small
pulmonary emboli then they would be of questionable clinical
significance. A venous Doppler ultrasound of the legs may be helpful
to exclude thrombus.

The heart is mildly enlarged. No pericardial effusion is seen. The
mid ascending thoracic aorta measures 37 mm in diameter.

Mediastinum/Nodes: No mediastinal or hilar adenopathy is seen. There
is diffuse enlargement of the thyroid gland suggesting thyroid
goiter. Clinical correlation is recommended.

Lungs/Pleura: On lung window images, no parenchymal infiltrate is
seen. There is no evidence of pleural effusion. No suspicious lung
nodule is seen. The central airway is patent.

Upper Abdomen: There are artifacts within the upper abdomen in this
large patient, but no significant abnormality is seen on the limited
views obtained.

Musculoskeletal: There are diffuse degenerative changes throughout
the thoracic spine. No compression deformity is seen.

Review of the MIP images confirms the above findings.
IMPRESSION: 1. Very small scattered nonocclusive pulmonary emboli cannot be
excluded in the branches of the pulmonary arteries to the lower
lobes. No central embolism is evident and these possible small
peripheral emboli are of questionable clinical significance. Venous
Doppler of the legs may be helpful.
2. Diffusely enlarged thyroid gland consistent with thyroid goiter.
Correlate clinically.
3. Prominent pulmonary arteries trunk most consistent with pulmonary
arterial hypertension.
